# Patient Record
Sex: Male | Born: 1980 | Race: White | Hispanic: No | Smoking: Former smoker
Health system: Southern US, Community
[De-identification: ages and names within clinical notes are randomized; demographics above are authoritative.]

## PROBLEM LIST (undated history)

## (undated) DIAGNOSIS — F419 Anxiety disorder, unspecified: Secondary | ICD-10-CM

## (undated) DIAGNOSIS — M549 Dorsalgia, unspecified: Secondary | ICD-10-CM

## (undated) DIAGNOSIS — Z91018 Allergy to other foods: Secondary | ICD-10-CM

## (undated) DIAGNOSIS — I639 Cerebral infarction, unspecified: Secondary | ICD-10-CM

## (undated) DIAGNOSIS — F32A Depression, unspecified: Secondary | ICD-10-CM

## (undated) DIAGNOSIS — T7840XA Allergy, unspecified, initial encounter: Secondary | ICD-10-CM

## (undated) HISTORY — DX: Cerebral infarction, unspecified: I63.9

## (undated) HISTORY — DX: Allergy, unspecified, initial encounter: T78.40XA

## (undated) HISTORY — PX: ADENOIDECTOMY: SUR15

## (undated) HISTORY — DX: Anxiety disorder, unspecified: F41.9

## (undated) HISTORY — DX: Dorsalgia, unspecified: M54.9

## (undated) HISTORY — PX: TONSILLECTOMY: SUR1361

## (undated) HISTORY — DX: Allergy to other foods: Z91.018

## (undated) HISTORY — DX: Depression, unspecified: F32.A

---

## 2008-05-10 ENCOUNTER — Ambulatory Visit: Payer: Self-pay | Admitting: Internal Medicine

## 2008-09-02 ENCOUNTER — Emergency Department: Payer: Self-pay | Admitting: Emergency Medicine

## 2010-11-07 ENCOUNTER — Ambulatory Visit: Payer: Self-pay | Admitting: Internal Medicine

## 2013-04-17 ENCOUNTER — Encounter (HOSPITAL_COMMUNITY): Payer: Self-pay | Admitting: Emergency Medicine

## 2013-04-17 ENCOUNTER — Emergency Department (HOSPITAL_COMMUNITY): Payer: Managed Care, Other (non HMO)

## 2013-04-17 ENCOUNTER — Emergency Department (HOSPITAL_COMMUNITY)
Admission: EM | Admit: 2013-04-17 | Discharge: 2013-04-17 | Disposition: A | Discharge status: Home / Self Care | Payer: Managed Care, Other (non HMO) | Attending: Emergency Medicine | Admitting: Emergency Medicine

## 2013-04-17 DIAGNOSIS — X500XXA Overexertion from strenuous movement or load, initial encounter: Secondary | ICD-10-CM | POA: Insufficient documentation

## 2013-04-17 DIAGNOSIS — M62838 Other muscle spasm: Secondary | ICD-10-CM | POA: Insufficient documentation

## 2013-04-17 DIAGNOSIS — M549 Dorsalgia, unspecified: Secondary | ICD-10-CM

## 2013-04-17 DIAGNOSIS — F17200 Nicotine dependence, unspecified, uncomplicated: Secondary | ICD-10-CM | POA: Insufficient documentation

## 2013-04-17 DIAGNOSIS — M545 Low back pain, unspecified: Secondary | ICD-10-CM | POA: Insufficient documentation

## 2013-04-17 DIAGNOSIS — Y9389 Activity, other specified: Secondary | ICD-10-CM | POA: Insufficient documentation

## 2013-04-17 DIAGNOSIS — Y92009 Unspecified place in unspecified non-institutional (private) residence as the place of occurrence of the external cause: Secondary | ICD-10-CM | POA: Insufficient documentation

## 2013-04-17 MED ORDER — IBUPROFEN 400 MG PO TABS
800.0000 mg | ORAL_TABLET | Freq: Once | ORAL | Status: AC
  Administered 2013-04-17: 800 mg via ORAL
  Filled 2013-04-17: qty 2

## 2013-04-17 MED ORDER — CYCLOBENZAPRINE HCL 10 MG PO TABS: 10.0000 mg | ORAL_TABLET | Freq: Two times a day (BID) | ORAL | Status: DC | PRN
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
## 2013-04-17 NOTE — ED Notes (Signed)
Pt c/o onset L lower back pain while having sexual intercourse last night. No urinary s/s. States " i think i pulled something."

## 2013-04-17 NOTE — ED Notes (Signed)
Pt ambulates without distress.  

## 2013-04-25 NOTE — ED Provider Notes (Signed)
Medical screening examination/treatment/procedure(s) were performed by non-physician practitioner and as supervising physician I was immediately available for consultation/collaboration.  EKG Interpretation   None         Junius Argyle, MD 04/25/13 [phone]

## 2013-07-08 ENCOUNTER — Emergency Department: Payer: Self-pay | Admitting: Emergency Medicine

## 2013-07-08 LAB — URINALYSIS, COMPLETE
BACTERIA: NONE SEEN
Bilirubin,UR: NEGATIVE
Blood: NEGATIVE
GLUCOSE, UR: NEGATIVE mg/dL (ref 0–75)
KETONE: NEGATIVE
LEUKOCYTE ESTERASE: NEGATIVE
Nitrite: NEGATIVE
Ph: 7 (ref 4.5–8.0)
Protein: NEGATIVE
Specific Gravity: 1.018 (ref 1.003–1.030)
Squamous Epithelial: NONE SEEN
WBC UR: <1 /HPF (ref 0–5)

## 2013-07-08 LAB — LIPASE, BLOOD: Lipase: 109 U/L (ref 73–393)

## 2013-07-08 LAB — COMPREHENSIVE METABOLIC PANEL
ALBUMIN: 4 g/dL (ref 3.4–5.0)
AST: 21 U/L (ref 15–37)
Alkaline Phosphatase: 71 U/L
Anion Gap: 3 — ABNORMAL LOW (ref 7–16)
BUN: 12 mg/dL (ref 7–18)
Bilirubin,Total: 0.6 mg/dL (ref 0.2–1.0)
CALCIUM: 9.1 mg/dL (ref 8.5–10.1)
CO2: 31 mmol/L (ref 21–32)
CREATININE: 1.07 mg/dL (ref 0.60–1.30)
Chloride: 108 mmol/L — ABNORMAL HIGH (ref 98–107)
EGFR (African American): >60
EGFR (Non-African Amer.): >60
GLUCOSE: 106 mg/dL — AB (ref 65–99)
OSMOLALITY: 283 (ref 275–301)
Potassium: 4.3 mmol/L (ref 3.5–5.1)
SGPT (ALT): 64 U/L (ref 12–78)
Sodium: 142 mmol/L (ref 136–145)
TOTAL PROTEIN: 6.6 g/dL (ref 6.4–8.2)

## 2013-07-08 LAB — CBC
HCT: 48.7 % (ref 40.0–52.0)
HGB: 16.9 g/dL (ref 13.0–18.0)
MCH: 30.6 pg (ref 26.0–34.0)
MCHC: 34.8 g/dL (ref 32.0–36.0)
MCV: 88 fL (ref 80–100)
Platelet: 145 10*3/uL — ABNORMAL LOW (ref 150–440)
RBC: 5.54 10*6/uL (ref 4.40–5.90)
RDW: 12.7 % (ref 11.5–14.5)
WBC: 7.2 10*3/uL (ref 3.8–10.6)

## 2015-03-20 IMAGING — CR DG LUMBAR SPINE COMPLETE 4+V
5 series · 5 of 5 positions shown · non-contrast
Comparison: None.

CLINICAL DATA: Persistent back pain

EXAM:
LUMBAR SPINE - COMPLETE 4+ VIEW

[t l-spine a.p.]
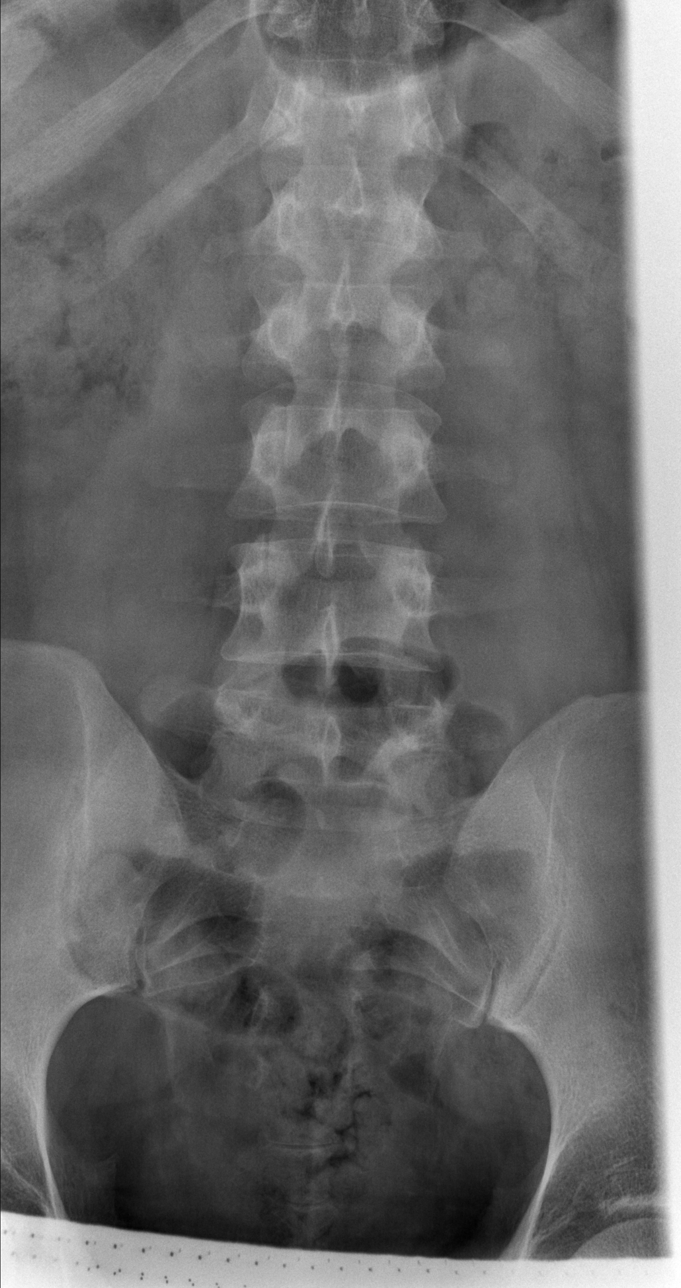

[t l-spine oblique exposure (1 of 2)]
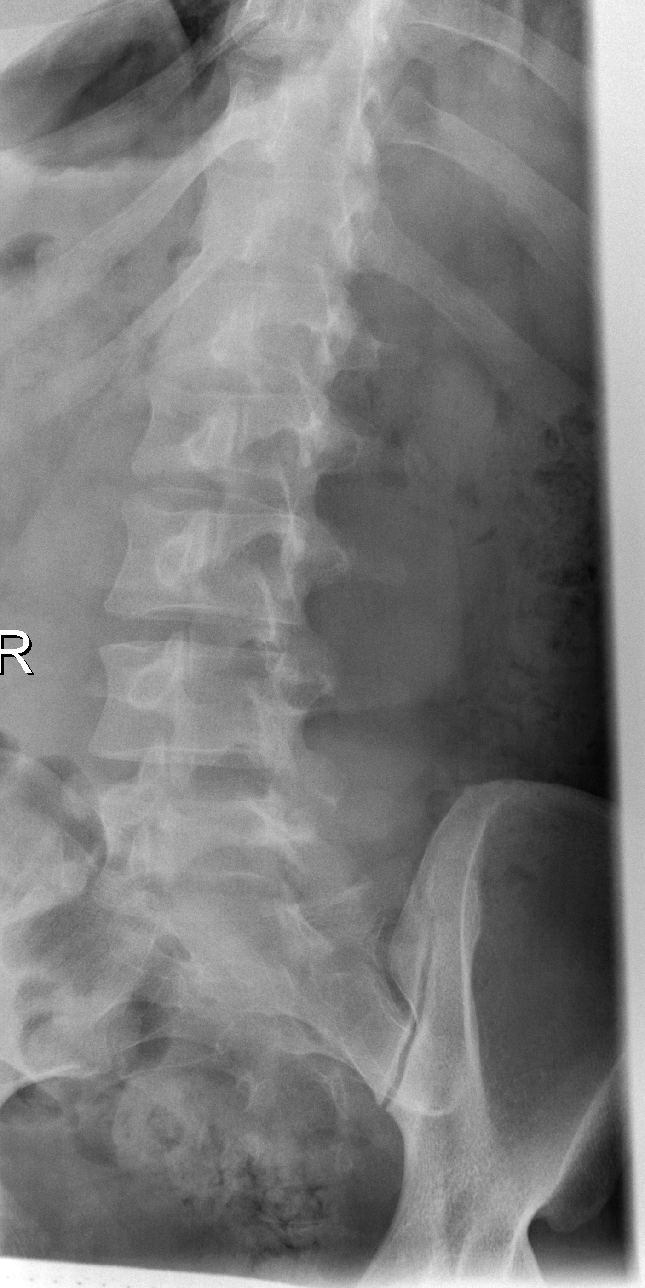

[t l-spine oblique exposure (2 of 2)]
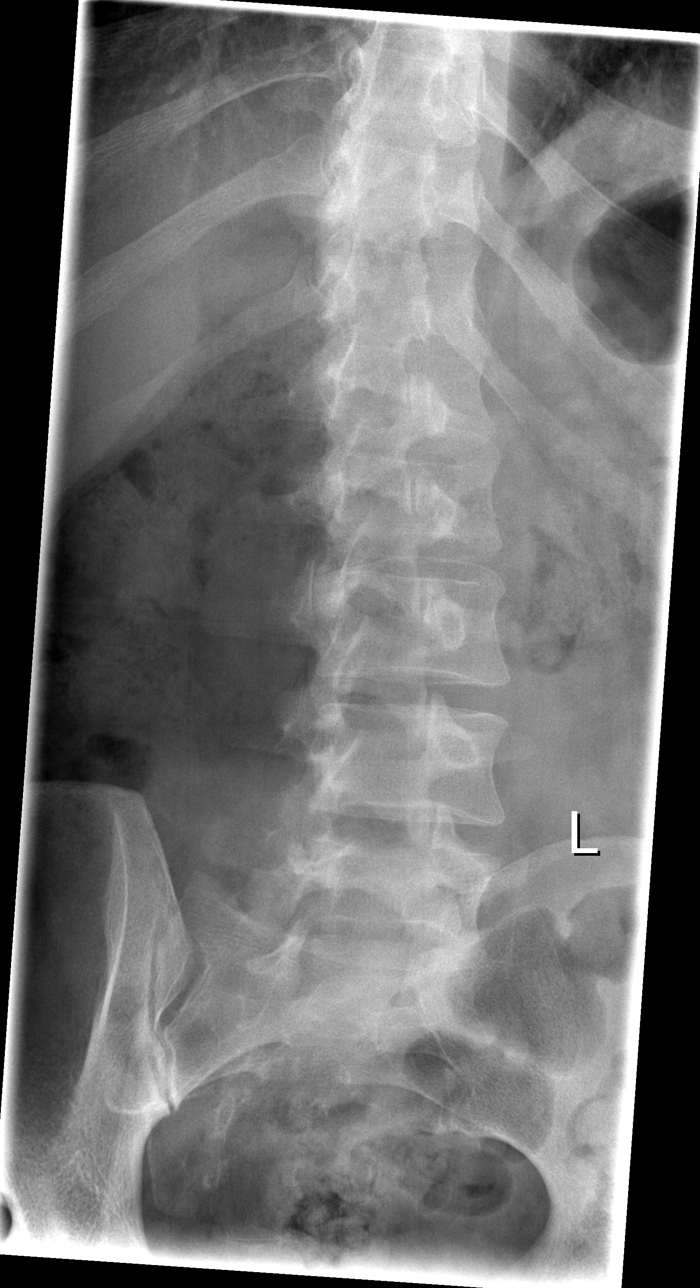

[t l-spine lat]
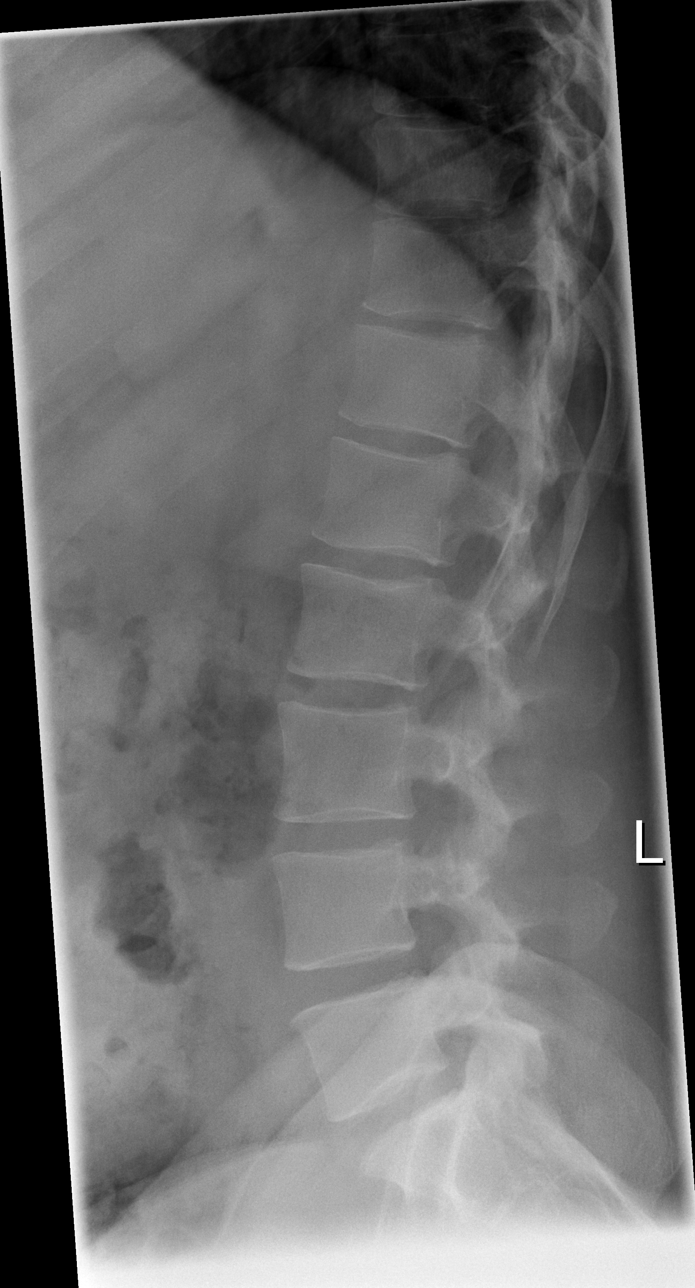

[t l-spine l5-s1 spot]
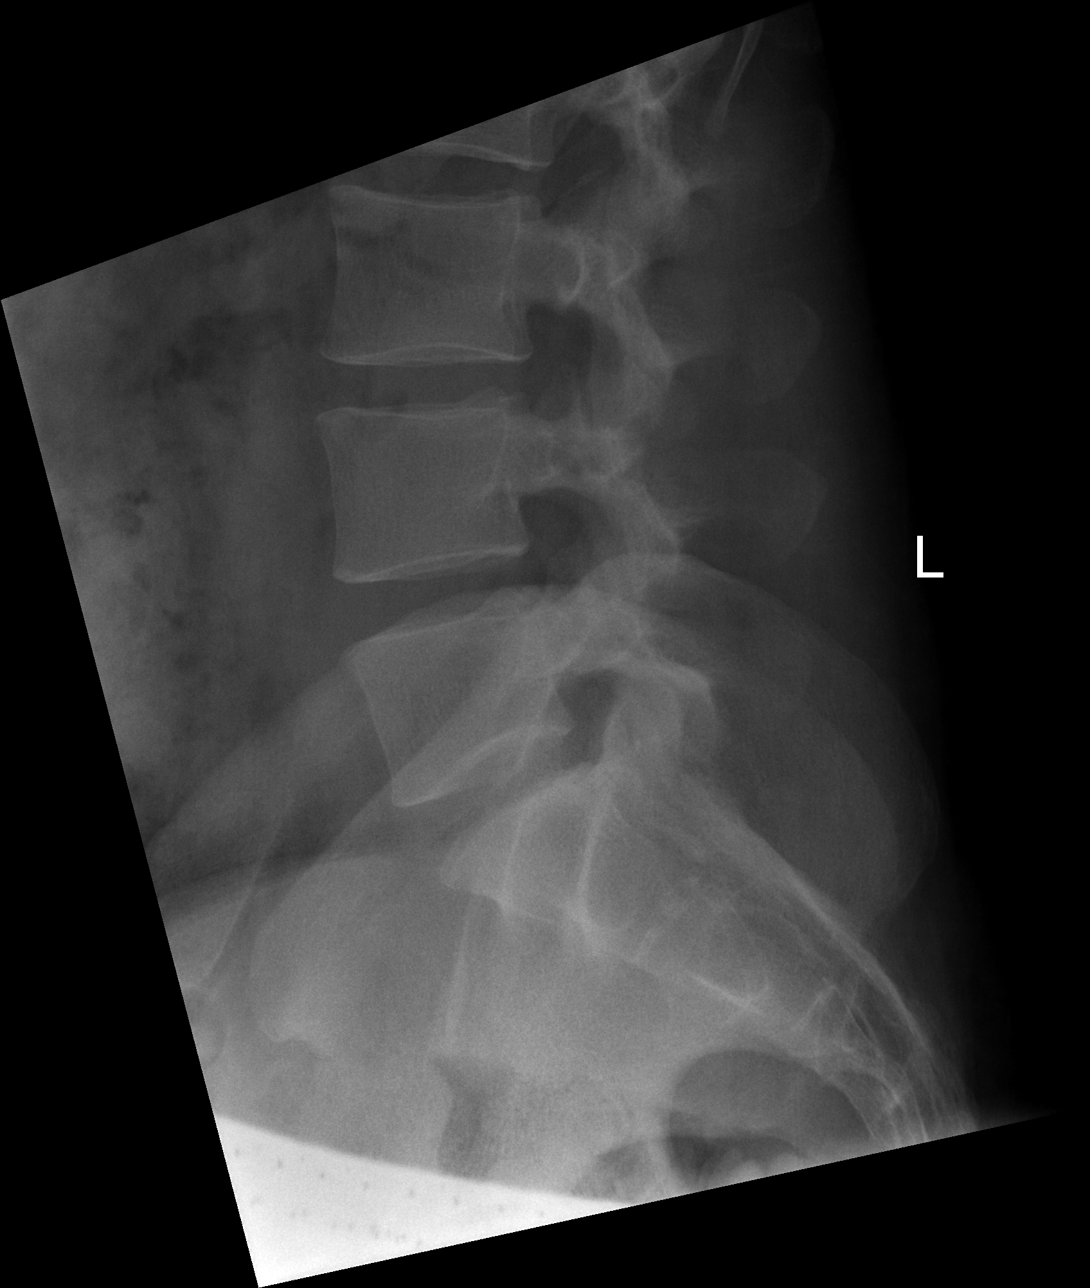

[5 of 5 positions shown; findings below may reference images not displayed]

FINDINGS: There is no evidence of lumbar spine fracture. Alignment is normal.
Intervertebral disc spaces are maintained.
IMPRESSION: No acute osseous finding

## 2018-08-30 DIAGNOSIS — R61 Generalized hyperhidrosis: Secondary | ICD-10-CM | POA: Insufficient documentation

## 2022-03-06 DIAGNOSIS — E669 Obesity, unspecified: Secondary | ICD-10-CM | POA: Insufficient documentation

## 2022-04-08 DIAGNOSIS — R159 Full incontinence of feces: Secondary | ICD-10-CM | POA: Insufficient documentation

## 2022-09-18 ENCOUNTER — Telehealth: Payer: Self-pay | Admitting: General Practice

## 2022-09-18 NOTE — Telephone Encounter (Addendum)
error 

## 2022-10-07 ENCOUNTER — Encounter: Payer: Self-pay | Admitting: Internal Medicine

## 2022-10-07 ENCOUNTER — Ambulatory Visit: Payer: 59 | Admitting: Internal Medicine

## 2022-10-07 VITALS — BP 112/76 | HR 86 | Temp 98.7°F | Ht 71.0 in | Wt 286.0 lb

## 2022-10-07 DIAGNOSIS — F320 Major depressive disorder, single episode, mild: Secondary | ICD-10-CM | POA: Diagnosis not present

## 2022-10-07 DIAGNOSIS — Z7689 Persons encountering health services in other specified circumstances: Secondary | ICD-10-CM

## 2022-10-07 MED ORDER — BUPROPION HCL 100 MG PO TABS: 100.0000 mg | ORAL_TABLET | Freq: Two times a day (BID) | ORAL | 0 refills | Status: DC
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
## 2022-10-07 NOTE — Assessment & Plan Note (Signed)
Start Wellbutrin 100mg  BID  Continue counseling  Recommended continuing regular exercise to help mood

## 2022-10-29 ENCOUNTER — Encounter: Payer: Self-pay | Admitting: Internal Medicine

## 2022-11-09 ENCOUNTER — Other Ambulatory Visit: Payer: Self-pay | Admitting: Internal Medicine

## 2022-11-09 DIAGNOSIS — F320 Major depressive disorder, single episode, mild: Secondary | ICD-10-CM

## 2022-11-10 NOTE — Telephone Encounter (Signed)
Pt called for a refill for his Wellbutrin.

## 2022-11-12 MED ORDER — BUPROPION HCL 100 MG PO TABS: 100.0000 mg | ORAL_TABLET | Freq: Two times a day (BID) | ORAL | 1 refills | Status: DC

## 2022-11-18 ENCOUNTER — Ambulatory Visit: Payer: 59 | Admitting: Internal Medicine

## 2022-11-18 ENCOUNTER — Encounter: Payer: Self-pay | Admitting: Internal Medicine

## 2022-11-18 VITALS — BP 120/82 | HR 85 | Temp 98.2°F | Ht 71.0 in | Wt 285.0 lb

## 2022-11-18 DIAGNOSIS — F320 Major depressive disorder, single episode, mild: Secondary | ICD-10-CM

## 2022-11-18 MED ORDER — BUPROPION HCL ER (XL) 150 MG PO TB24: 150.0000 mg | ORAL_TABLET | Freq: Every day | ORAL | 1 refills | Status: DC

## 2022-11-18 NOTE — Assessment & Plan Note (Signed)
D/C Wellbutrin 100mg  BID d/t insomnia.  Start Wellbutrin XL 150mg  PO daily

## 2022-11-18 NOTE — Progress Notes (Signed)
[facility] [address] [place] [address] Dept: [phone] Dept Fax: [phone]  Acute Care Office Visit  Subjective:   Chris Mcconnell 1980/09/29 11/18/2022  Chief Complaint  Patient presents with   Follow-up    Medications     HPI:    DEPRESSION: Chris Mcconnell presents for the medical management of depression. Reports medication is working very well, except when he takes the evening dose, he has difficulty falling asleep. Reports only getting about 4 hours of sleep.  He states if he forgets to take the evening dose, he has no trouble sleeping.  Current medication regimen: Wellbutrin 100mg  BID  Counseling: yes Well controlled: yes Denies SI/HI.      11/18/2022    8:02 AM 10/07/2022    8:34 AM  PHQ9 SCORE ONLY  PHQ-9 Total Score 0 8        11/18/2022    8:02 AM 10/07/2022    8:34 AM  PHQ9 SCORE ONLY  PHQ-9 Total Score 0 8     The following portions of the patient's history were reviewed and updated as appropriate: past medical history, past surgical history, family history, social history, allergies, medications, and problem list.   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 No pertinent past medical history. Past Surgical History:  Procedure Laterality Date   ADENOIDECTOMY     TONSILLECTOMY     History reviewed. No pertinent family history. Outpatient Medications Prior to Visit  Medication Sig Dispense Refill   buPROPion (WELLBUTRIN) 100 MG tablet Take 1 tablet (100 mg total) by mouth 2 (two) times daily. 180 tablet 1   No facility-administered medications prior to visit.   Allergies  Allergen Reactions   Latex Other (See Comments)    Stomach pain     ROS: A complete ROS was performed with pertinent positives/negatives noted in  the HPI. The remainder of the ROS are negative.    Objective:   Today's Vitals   11/18/22 0802  BP: 120/82  Pulse: 85  Temp: 98.2 F (36.8 C)  TempSrc: Temporal  SpO2: 98%  Weight: 285 lb (129.3 kg)  Height: 5\' 11"  (1.803 m)    GENERAL: Well-appearing, in NAD. Well nourished.  SKIN: Pink, warm and dry.  RESPIRATORY: Respirations even and non-labored. NEUROLOGIC: Steady, even gait.  PSYCH/MENTAL STATUS: Alert, oriented x 3. Cooperative, appropriate mood and affect.    No results found for any visits on 11/18/22.    Assessment & Plan:  Assessment and Plan      1. Depression, major, single episode, mild (HCC) - buPROPion (WELLBUTRIN XL) 150 MG 24 hr tablet; Take 1 tablet (150 mg total) by mouth daily.  Dispense: 90 tablet; Refill: 1 - d/c Wellbutrin 100mg  BID - continue counseling       Meds ordered this encounter  Medications   buPROPion (WELLBUTRIN XL) 150 MG 24 hr tablet    Sig: Take 1 tablet (150 mg total) by mouth daily.    Dispense:  90 tablet    Refill:  1    Order Specific Question:   Supervising Provider    Answer:   Garnette Gunner [[id number]]   Lab Orders  No laboratory test(s) ordered today   No images are attached to the encounter or orders placed in the encounter.  Return in about 4 months (  around 03/21/2023) for Fasting Annual Physical Exam, and as needed for chronic conditions. .   Of note, portions of this note may have been created with voice recognition software Physicist, medical). While this note has been edited for accuracy, occasional wrong-word or 'sound-a-like' substitutions may have occurred due to the inherent limitations of voice recognition software.  Salvatore Decent, FNP

## 2023-03-23 ENCOUNTER — Ambulatory Visit (INDEPENDENT_AMBULATORY_CARE_PROVIDER_SITE_OTHER): Payer: 59 | Admitting: Internal Medicine

## 2023-03-23 ENCOUNTER — Encounter: Payer: Self-pay | Admitting: Internal Medicine

## 2023-03-23 VITALS — BP 124/78 | HR 103 | Temp 98.7°F | Ht 71.0 in | Wt 290.0 lb

## 2023-03-23 DIAGNOSIS — Z6841 Body Mass Index (BMI) 40.0 and over, adult: Secondary | ICD-10-CM

## 2023-03-23 DIAGNOSIS — Z0000 Encounter for general adult medical examination without abnormal findings: Secondary | ICD-10-CM | POA: Diagnosis not present

## 2023-03-23 DIAGNOSIS — E66813 Obesity, class 3: Secondary | ICD-10-CM | POA: Insufficient documentation

## 2023-03-23 LAB — COMPREHENSIVE METABOLIC PANEL
ALT: 36 U/L (ref 0–53)
AST: 19 U/L (ref 0–37)
Albumin: 4.4 g/dL (ref 3.5–5.2)
Alkaline Phosphatase: 73 U/L (ref 39–117)
BUN: 15 mg/dL (ref 6–23)
CO2: 24 meq/L (ref 19–32)
Calcium: 9 mg/dL (ref 8.4–10.5)
Chloride: 107 meq/L (ref 96–112)
Creatinine, Ser: 1.19 mg/dL (ref 0.40–1.50)
GFR: 75.61 mL/min (ref >60.00)
Glucose, Bld: 93 mg/dL (ref 70–99)
Potassium: 4.2 meq/L (ref 3.5–5.1)
Sodium: 142 meq/L (ref 135–145)
Total Bilirubin: 0.8 mg/dL (ref 0.2–1.2)
Total Protein: 6.1 g/dL (ref 6.0–8.3)

## 2023-03-23 LAB — CBC WITH DIFFERENTIAL/PLATELET
Basophils Absolute: 0.1 10*3/uL (ref 0.0–0.1)
Basophils Relative: 0.8 % (ref 0.0–3.0)
Eosinophils Absolute: 0.2 10*3/uL (ref 0.0–0.7)
Eosinophils Relative: 2.9 % (ref 0.0–5.0)
HCT: 51.8 % (ref 39.0–52.0)
Hemoglobin: 17.7 g/dL — ABNORMAL HIGH (ref 13.0–17.0)
Lymphocytes Relative: 15.2 % (ref 12.0–46.0)
Lymphs Abs: 1.1 10*3/uL (ref 0.7–4.0)
MCHC: 34.2 g/dL (ref 30.0–36.0)
MCV: 86.3 fL (ref 78.0–100.0)
Monocytes Absolute: 0.5 10*3/uL (ref 0.1–1.0)
Monocytes Relative: 7.5 % (ref 3.0–12.0)
Neutro Abs: 5.2 10*3/uL (ref 1.4–7.7)
Neutrophils Relative %: 73.6 % (ref 43.0–77.0)
Platelets: 163 10*3/uL (ref 150.0–400.0)
RBC: 6 Mil/uL — ABNORMAL HIGH (ref 4.22–5.81)
RDW: 13.1 % (ref 11.5–15.5)
WBC: 7 10*3/uL (ref 4.0–10.5)

## 2023-03-23 LAB — LIPID PANEL
Cholesterol: 185 mg/dL (ref 0–200)
HDL: 34.8 mg/dL — ABNORMAL LOW (ref >39.00)
LDL Cholesterol: 120 mg/dL — ABNORMAL HIGH (ref 0–99)
NonHDL: 149.87
Total CHOL/HDL Ratio: 5
Triglycerides: 151 mg/dL — ABNORMAL HIGH (ref 0.0–149.0)
VLDL: 30.2 mg/dL (ref 0.0–40.0)

## 2023-03-23 LAB — TSH: TSH: 1.2 u[IU]/mL (ref 0.35–5.50)

## 2023-03-23 NOTE — Progress Notes (Signed)
Subjective:   Chris Mcconnell 1980-07-08 03/23/2023  CC: Chief Complaint  Patient presents with   Annual Exam    HPI: Chris Mcconnell is a 42 y.o. male who presents for a routine health maintenance exam.  Labs collected at time of visit.    WEIGHT MANAGEMENT: [name] presents to discuss weight management. He would like to lose weight. He states he has noticed with the weight gain he gets easily tired with simple tasks. Would like for this to change.  Adhering to healthy diet: no  Regular exercise: very little, some walking  Wt Readings from Last 3 Encounters:  03/23/23 290 lb (131.5 kg)  11/18/22 285 lb (129.3 kg)  10/07/22 286 lb (129.7 kg)    HEALTH SCREENINGS: - PSA (50+): Not applicable  No results found for: "PSA1", "PSA"   - Colonoscopy (45+): Not applicable  - AAA Screening: Not applicable  Men age 43-75 who have ever smoked - Lung Cancer screening with low-dose CT: Not applicable Adults age 48-80 who are current cigarette smokers or quit within the last 15 years. Must have 20 pack year history.   Depression and Anxiety Screen done today and results listed below:     03/23/2023    8:55 AM 11/18/2022    8:02 AM 10/07/2022    8:34 AM  Depression screen PHQ 2/9  Decreased Interest 2 0 1  Down, Depressed, Hopeless 1 0 2  PHQ - 2 Score 3 0 3  Altered sleeping 1  1  Tired, decreased energy 1  1  Change in appetite 2  0  Feeling bad or failure about yourself  1  1  Trouble concentrating 0  1  Moving slowly or fidgety/restless 0  0  Suicidal thoughts 0  1  PHQ-9 Score 8  8  Difficult doing work/chores Somewhat difficult  Somewhat difficult      03/23/2023    8:56 AM 10/07/2022    8:34 AM  GAD 7 : Generalized Anxiety Score  Nervous, Anxious, on Edge 1 1  Control/stop worrying 1 1  Worry too much - different things 1 1  Trouble relaxing 1 1  Restless 0 0  Easily annoyed or irritable 1 0  Afraid - awful might happen 1 1  Total GAD 7 Score 6 5   Anxiety Difficulty Somewhat difficult Somewhat difficult    IMMUNIZATIONS:  - Tdap: Tetanus vaccination status reviewed: last tetanus booster within 10 years. - Influenza: Refused - Pneumovax: Not applicable - Prevnar: Not applicable - Zostavax vaccine (50+): Not applicable   Past medical history, surgical history, medications, allergies, family history and social history reviewed with patient today and changes made to appropriate areas of the chart.   History reviewed. No pertinent past medical history.  Past Surgical History:  Procedure Laterality Date   ADENOIDECTOMY     TONSILLECTOMY      Current Outpatient Medications on File Prior to Visit  Medication Sig   buPROPion (WELLBUTRIN XL) 150 MG 24 hr tablet Take 1 tablet (150 mg total) by mouth daily.   No current facility-administered medications on file prior to visit.    Allergies  Allergen Reactions   Latex Other (See Comments)    Stomach pain     Social History   Socioeconomic History   Marital status: Single    Spouse name: Not on file   Number of children: Not on file   Years of education: Not on file   Highest education level: Associate degree: occupational, technical,  or vocational program  Occupational History   Not on file  Tobacco Use   Smoking status: Some Days    Types: Cigars   Smokeless tobacco: Not on file  Substance and Sexual Activity   Alcohol use: Not Currently   Drug use: No   Sexual activity: Not Currently  Other Topics Concern   Not on file  Social History Narrative   Not on file   Social Determinants of Health   Financial Resource Strain: Low Risk  (11/17/2022)   Overall Financial Resource Strain (CARDIA)    Difficulty of Paying Living Expenses: Not hard at all  Food Insecurity: No Food Insecurity (11/17/2022)   Hunger Vital Sign    Worried About Running Out of Food in the Last Year: Never true    Ran Out of Food in the Last Year: Never true  Transportation Needs: No  Transportation Needs (11/17/2022)   PRAPARE - Administrator, Civil Service (Medical): No    Lack of Transportation (Non-Medical): No  Physical Activity: Unknown (11/17/2022)   Exercise Vital Sign    Days of Exercise per Week: 0 days    Minutes of Exercise per Session: Not on file  Stress: Stress Concern Present (11/17/2022)   [employer] of Occupational Health - Occupational Stress Questionnaire    Feeling of Stress : Rather much  Social Connections: Moderately Integrated (11/17/2022)   Social Connection and Isolation Panel [NHANES]    Frequency of Communication with Friends and Family: Three times a week    Frequency of Social Gatherings with Friends and Family: Never    Attends Religious Services: More than 4 times per year    Active Member of [employer] or Organizations: Yes    Attends Engineer, structural: More than 4 times per year    Marital Status: Divorced  Catering manager Violence: Not on file   Social History   Tobacco Use  Smoking Status Some Days   Types: Cigars  Smokeless Tobacco Not on file   Social History   Substance and Sexual Activity  Alcohol Use Not Currently     History reviewed. No pertinent family history.   ROS: Denies fever, fatigue, hearing or vision changes, cardiac or respiratory complaints. Denies neurological deficits, musculoskeletal complaints, gastrointestinal or genitourinary complaints, mental health complaints, and skin changes.   Objective:   Today's Vitals   03/23/23 0851  BP: 124/78  Pulse: (!) 103  Temp: 98.7 F (37.1 C)  TempSrc: Temporal  SpO2: 98%  Weight: 290 lb (131.5 kg)  Height: 5\' 11"  (1.803 m)    GENERAL APPEARANCE: Well-appearing, in NAD. Well nourished.  SKIN: Pink, warm and dry. Turgor normal. No rash, lesion, ulceration, or ecchymoses.  Skin tag to R. Buttock. Hair evenly distributed.  HEENT: HEAD: Normocephalic.  EYES: PERRLA. EOMI. Lids intact w/o defect. Sclera white, Conjunctiva pink  w/o exudate.  EARS: External ear w/o redness, swelling, masses or lesions. EAC clear. TM's intact, translucent w/o bulging, appropriate landmarks visualized. Appropriate acuity to conversational tones.  NOSE: Septum midline w/o deformity. Nares patent, mucosa pink and non-inflamed w/o drainage. No sinus tenderness.  THROAT: Uvula midline. Oropharynx clear. Tonsils absent. Oral mucosa pink and moist.  NECK: Supple, Trachea midline. Full ROM w/o pain or tenderness. No lymphadenopathy. Thyroid non-tender w/o enlargement or palpable masses.  RESPIRATORY: Chest wall symmetrical w/o masses. Respirations even and non-labored. Breath sounds clear to auscultation bilaterally. No wheezes, rales, rhonchi, or crackles. CARDIAC: S1, S2 present, regular rate and rhythm. No gallops,  murmurs, rubs, or clicks.  Capillary refill <2 seconds. Peripheral pulses 2+ bilaterally. GI: Abdomen soft w/o distention. Normoactive bowel sounds. No palpable masses or tenderness. No guarding or rebound tenderness. Liver and spleen w/o tenderness or enlargement. No CVA tenderness.  GU:  deferred exam. MSK: Muscle tone and strength appropriate for age, w/o atrophy or abnormal movement. EXTREMITIES: Active ROM intact, w/o tenderness, crepitus, or contracture. No obvious joint deformities or effusions. No clubbing, edema, or cyanosis.  NEUROLOGIC: CN's II-XII intact. Motor strength symmetrical with no obvious weakness. No sensory deficits. Steady, even gait.  PSYCH/MENTAL STATUS: Alert, oriented x 3. Cooperative, appropriate mood and affect.   No results found for this or any previous visit.  Assessment & Plan:  1. Encounter for general adult medical examination without abnormal findings - CBC with Differential/Platelet - Comprehensive metabolic panel - TSH - Lipid panel  2. Class 3 severe obesity due to excess calories without serious comorbidity with body mass index (BMI) of 40.0 to 44.9 in adult (HCC) - Amb Ref to Medical  Weight Management - discussed healthy eating and begin regular exercise routine    Orders Placed This Encounter  Procedures   CBC with Differential/Platelet   Comprehensive metabolic panel   TSH   Lipid panel   Amb Ref to Medical Weight Management    Referral Priority:   Routine    Referral Type:   Consultation    Number of Visits Requested:   1    PATIENT COUNSELING: - Encouraged to adjust caloric intake to maintain or achieve ideal body weight, to reduce intake of dietary saturated fat and total fat, to limit sodium intake by avoiding high sodium foods and not adding table salt, and to maintain adequate dietary potassium and calcium preferably from fresh fruits, vegetables, and low-fat dairy products.   - Advised to avoid cigarette smoking. - Discussed with the patient that most people either abstain from alcohol or drink within safe limits (<=14/week and <=4 drinks/occasion for males, <=7/weeks and <= 3 drinks/occasion for females) and that the risk for alcohol disorders and other health effects rises proportionally with the number of drinks per week and how often a drinker exceeds daily limits. - Discussed cessation/primary prevention of drug use and availability of treatment for abuse.   - Stressed the importance of regular exercise - Injury prevention: Discussed safety belts, safety helmets, smoke detector, smoking near bedding or upholstery.  - Sexuality: Discussed sexually transmitted diseases, partner selection, use of condoms, avoidance of unintended pregnancy  and contraceptive alternatives.   NEXT PREVENTATIVE PHYSICAL DUE IN 1 YEAR.  Return in about 6 months (around 09/20/2023) for Anxiety/Depression.  Salvatore Decent, FNP

## 2023-04-27 DIAGNOSIS — Z0289 Encounter for other administrative examinations: Secondary | ICD-10-CM

## 2023-04-28 ENCOUNTER — Encounter: Payer: Self-pay | Admitting: Family Medicine

## 2023-04-28 ENCOUNTER — Ambulatory Visit: Payer: 59 | Admitting: Family Medicine

## 2023-04-28 VITALS — BP 133/82 | HR 94 | Temp 98.0°F | Ht 71.0 in | Wt 287.0 lb

## 2023-04-28 DIAGNOSIS — Z6841 Body Mass Index (BMI) 40.0 and over, adult: Secondary | ICD-10-CM | POA: Diagnosis not present

## 2023-04-28 DIAGNOSIS — E66813 Obesity, class 3: Secondary | ICD-10-CM

## 2023-04-28 DIAGNOSIS — F320 Major depressive disorder, single episode, mild: Secondary | ICD-10-CM

## 2023-04-28 NOTE — Progress Notes (Signed)
Office: [phone]  /  Fax: [phone]   Initial Visit  Chris Mcconnell was seen in clinic today to evaluate for obesity. He is interested in losing weight to improve overall health and reduce the risk of weight related complications. He presents today to review program treatment options, initial physical assessment, and evaluation.     He was referred by: PCP  When asked what else they would like to accomplish? He states:   Weight history:  he had a more active job at [employer] and was working out and moved to a sedentary job 12 years ago.  He has gained > 80 lb since then.  He is now feeling poorly at this weight.    When asked how has your weight affected you? He states: Contributed to orthopedic problems or mobility issues, Having fatigue, and Having poor endurance  Some associated conditions: Other: migraines and low back pain  Contributing factors: Consumption of processed foods, Reduced physical activity, and Eating patterns  Weight promoting medications identified: None  Current nutrition plan: None Latex allergy   Current level of physical activity: NEAT  Current or previous pharmacotherapy: Buproprion  Response to medication: Ineffective so it was discontinued   Past medical history includes:  History reviewed. No pertinent past medical history.   Objective:   BP 133/82   Pulse 94   Temp 98 F (36.7 C)   Ht 5\' 11"  (1.803 m)   Wt 287 lb (130.2 kg)   SpO2 98%   BMI 40.03 kg/m  He was weighed on the bioimpedance scale: Body mass index is 40.03 kg/m.  Peak Weight:290 , Body Fat%:37.2, Visceral Fat Rating:21, Weight trend over the last 12 months: Increasing  General:  Alert, oriented and cooperative. Patient is in no acute distress.  Respiratory: Normal respiratory effort, no problems with respiration noted   Gait: able to ambulate independently  Mental Status: Normal mood and affect. Normal behavior. Normal judgment and thought content.   DIAGNOSTIC DATA  REVIEWED:  BMET    Component Value Date/Time   NA 142 03/23/2023 0924   NA 142 07/08/2013 1203   K 4.2 03/23/2023 0924   K 4.3 07/08/2013 1203   CL 107 03/23/2023 0924   CL 108 (H) 07/08/2013 1203   CO2 24 03/23/2023 0924   CO2 31 07/08/2013 1203   GLUCOSE 93 03/23/2023 0924   GLUCOSE 106 (H) 07/08/2013 1203   BUN 15 03/23/2023 0924   BUN 12 07/08/2013 1203   CREATININE 1.19 03/23/2023 0924   CREATININE 1.07 07/08/2013 1203   CALCIUM 9.0 03/23/2023 0924   CALCIUM 9.1 07/08/2013 1203   GFRNONAA >60 07/08/2013 1203   GFRAA >60 07/08/2013 1203   No results found for: "HGBA1C" No results found for: "INSULIN" CBC    Component Value Date/Time   WBC 7.0 03/23/2023 0924   RBC 6.00 (H) 03/23/2023 0924   HGB 17.7 (H) 03/23/2023 0924   HGB 16.9 07/08/2013 1203   HCT 51.8 03/23/2023 0924   HCT 48.7 07/08/2013 1203   PLT 163.0 03/23/2023 0924   PLT 145 (L) 07/08/2013 1203   MCV 86.3 03/23/2023 0924   MCV 88 07/08/2013 1203   MCH 30.6 07/08/2013 1203   MCHC 34.2 03/23/2023 0924   RDW 13.1 03/23/2023 0924   RDW 12.7 07/08/2013 1203   Iron/TIBC/Ferritin/ %Sat No results found for: "IRON", "TIBC", "FERRITIN", "IRONPCTSAT" Lipid Panel     Component Value Date/Time   CHOL 185 03/23/2023 0924   TRIG 151.0 (H) 03/23/2023 6578  HDL 34.80 (L) 03/23/2023 0924   CHOLHDL 5 03/23/2023 0924   VLDL 30.2 03/23/2023 0924   LDLCALC 120 (H) 03/23/2023 0924   Hepatic Function Panel     Component Value Date/Time   PROT 6.1 03/23/2023 0924   PROT 6.6 07/08/2013 1203   ALBUMIN 4.4 03/23/2023 0924   ALBUMIN 4.0 07/08/2013 1203   AST 19 03/23/2023 0924   AST 21 07/08/2013 1203   ALT 36 03/23/2023 0924   ALT 64 07/08/2013 1203   ALKPHOS 73 03/23/2023 0924   ALKPHOS 71 07/08/2013 1203   BILITOT 0.8 03/23/2023 0924   BILITOT 0.6 07/08/2013 1203      Component Value Date/Time   TSH 1.20 03/23/2023 0924     Assessment and Plan:   Depression, major, single episode, mild  (HCC)  Class 3 severe obesity due to excess calories with body mass index (BMI) of 40.0 to 44.9 in adult, unspecified whether serious comorbidity present (HCC)        Obesity Treatment / Action Plan:  Patient will work on garnering support from family and friends to begin weight loss journey. Will work on eliminating or reducing the presence of highly palatable, calorie dense foods in the home. Will complete provided nutritional and psychosocial assessment questionnaire before the next appointment. Will be scheduled for indirect calorimetry to determine resting energy expenditure in a fasting state.  This will allow [place] to create a reduced calorie, high-protein meal plan to promote loss of fat mass while preserving muscle mass. Will think about ideas on how to incorporate physical activity into their daily routine. Counseled on the health benefits of losing 5%-15% of total body weight. Was counseled on nutritional approaches to weight loss and benefits of reducing processed foods and consuming plant-based foods and high quality protein as part of nutritional weight management. Was counseled on pharmacotherapy and role as an adjunct in weight management.   Obesity Education Performed Today:  He was weighed on the bioimpedance scale and results were discussed and documented in the synopsis.  We discussed obesity as a disease and the importance of a more detailed evaluation of all the factors contributing to the disease.  We discussed the importance of long term lifestyle changes which include nutrition, exercise and behavioral modifications as well as the importance of customizing this to his specific health and social needs.  We discussed the benefits of reaching a healthier weight to alleviate the symptoms of existing conditions and reduce the risks of the biomechanical, metabolic and psychological effects of obesity.  Chris Mcconnell appears to be in the action stage of change and states  they are ready to start intensive lifestyle modifications and behavioral modifications.  20 minutes was spent today on this visit including the above counseling, pre-visit chart review, and post-visit documentation.  Reviewed by clinician on day of visit: allergies, medications, problem list, medical history, surgical history, family history, social history, and previous encounter notes pertinent to obesity diagnosis.    Seymour Bars, D.O. DABFM, DABOM [facility] [phone] [place], [place] [address] [phone]

## 2023-05-21 ENCOUNTER — Ambulatory Visit (INDEPENDENT_AMBULATORY_CARE_PROVIDER_SITE_OTHER): Payer: 59 | Admitting: Family Medicine

## 2023-05-21 ENCOUNTER — Encounter: Payer: Self-pay | Admitting: Family Medicine

## 2023-05-21 VITALS — BP 137/84 | HR 87 | Temp 98.1°F | Ht 71.0 in | Wt 287.0 lb

## 2023-05-21 DIAGNOSIS — G43709 Chronic migraine without aura, not intractable, without status migrainosus: Secondary | ICD-10-CM | POA: Diagnosis not present

## 2023-05-21 DIAGNOSIS — G8929 Other chronic pain: Secondary | ICD-10-CM | POA: Diagnosis not present

## 2023-05-21 DIAGNOSIS — F320 Major depressive disorder, single episode, mild: Secondary | ICD-10-CM | POA: Diagnosis not present

## 2023-05-21 DIAGNOSIS — Z6841 Body Mass Index (BMI) 40.0 and over, adult: Secondary | ICD-10-CM

## 2023-05-21 DIAGNOSIS — M545 Low back pain, unspecified: Secondary | ICD-10-CM

## 2023-05-21 DIAGNOSIS — R5383 Other fatigue: Secondary | ICD-10-CM

## 2023-05-21 DIAGNOSIS — D582 Other hemoglobinopathies: Secondary | ICD-10-CM | POA: Diagnosis not present

## 2023-05-21 DIAGNOSIS — E662 Morbid (severe) obesity with alveolar hypoventilation: Secondary | ICD-10-CM

## 2023-05-21 DIAGNOSIS — R0602 Shortness of breath: Secondary | ICD-10-CM

## 2023-05-21 DIAGNOSIS — G4730 Sleep apnea, unspecified: Secondary | ICD-10-CM

## 2023-05-21 DIAGNOSIS — E66813 Obesity, class 3: Secondary | ICD-10-CM

## 2023-05-21 NOTE — Progress Notes (Signed)
1  At a Glance:  Vitals Temp: 98.1 F (36.7 C) BP: 137/84 Pulse Rate: 87 SpO2: 97 %   Anthropometric Measurements Height: 5\' 11"  (1.803 m) Weight: 287 lb (130.2 kg) BMI (Calculated): 40.05 Starting Weight: 287lb   Body Composition  Body Fat %: 38 % Fat Mass (lbs): 109 lbs Muscle Mass (lbs): 169.2 lbs Total Body Water (lbs): 123.6 lbs Visceral Fat Rating : 21   Other Clinical Data Fasting: Yes Labs: Yes Today's Visit #: 1 Starting Date: 05/21/23    EKG: Normal sinus rhythm, rate 87.  Indirect Calorimeter completed today shows a VO2 of 438 and a REE of 3024.  His calculated basal metabolic rate is 5284 thus his basal metabolic rate is better than expected.  Chief Complaint:  Obesity   Subjective:  Chris Mcconnell (MR# [id number]) is a 43 y.o. male who presents for evaluation and treatment of obesity and related comorbidities.   Chris Mcconnell is currently in the action stage of change and ready to dedicate time achieving and maintaining a healthier weight. Gent is interested in becoming our patient and working on intensive lifestyle modifications including (but not limited to) diet and exercise for weight loss.  Chris Mcconnell has been struggling with his weight. He has been unsuccessful in either losing weight, maintaining weight loss, or reaching his healthy weight goal.  The has gained weight since working a sedentary job.  He has a hx of depression and chronic daily headache.    Chris Mcconnell's habits were reviewed today and are as follows: his desired weight loss is 60 lbs. he skips meals frequently, usually breakfast and eats out for lunch.  he is frequently drinking liquids with calories, but quit soda and sweet tea more recently.  He likes to have juice with his 49 yo son.  he frequently makes poor food choices, he has problems with excessive hunger, and he frequently eats larger portions than normal.  He eats out 10+ meals/ week and overeats at mealtime.  Denies over snacking.   Other  Fatigue Ayrton admits to daytime somnolence and admits to waking up still tired. Patient has a history of symptoms of morning fatigue. Chris Mcconnell generally gets 6 or 7 hours of sleep per night, and states that he has generally restful sleep. Snoring is present. Apneic episodes are not present. Epworth Sleepiness Score is 5.  He has chronic daily headache, starting in the mornings.  Shortness of Breath Chris Mcconnell notes increasing shortness of breath with exercising and seems to be worsening over time with weight gain. He notes getting out of breath sooner with activity than he used to. This has gotten worse recently. Chris Mcconnell denies shortness of breath at rest or orthopnea.   Depression Screen Jahlil's Food and Mood (modified PHQ-9) score was 6.     03/23/2023    8:55 AM  Depression screen PHQ 2/9  Decreased Interest 2  Down, Depressed, Hopeless 1  PHQ - 2 Score 3  Altered sleeping 1  Tired, decreased energy 1  Change in appetite 2  Feeling bad or failure about yourself  1  Trouble concentrating 0  Moving slowly or fidgety/restless 0  Suicidal thoughts 0  PHQ-9 Score 8  Difficult doing work/chores Somewhat difficult     Assessment and Plan:   Other Fatigue Chris Mcconnell does feel that his weight is causing his energy to be lower than it should be. Fatigue may be related to obesity, depression or many other causes. Labs will be ordered, and in the meanwhile, Chris Mcconnell will focus on  self care including making healthy food choices, increasing physical activity and focusing on stress reduction.  Shortness of Breath Other does feel that he gets out of breath more easily that he used to when he exercises. Chris Mcconnell's shortness of breath appears to be obesity related and exercise induced. He has agreed to work on weight loss and gradually increase exercise to treat his exercise induced shortness of breath. Will continue to monitor closely.  Chris Mcconnell had a positive depression screening. Depression is commonly associated  with obesity and often results in emotional eating behaviors. We will monitor this closely and work on CBT to help improve the non-hunger eating patterns. Referral to Psychology may be required if no improvement is seen as he continues in our clinic.    Problem List Items Addressed This Visit     Depression, major, single episode, mild (HCC)   He reports a stable mood on Wellbutrin XL 150 mg once daily. He reports lacking motivation to get up on the weekends He has a stable job with good support  Continue current medications.  Consider adding CBT with Dr. Dewaine Conger      Chronic midline low back pain   Worsening.  Patient complains of worsening low back pain since adding weight over the past several years.  This has limited his ability to do vigorous exercise.  He denies intake of over-the-counter pain medication.  Will look for improvements in back pain with weight reduction. Adding in gentle stretching or physical therapy may be beneficial      Chronic migraine without aura without status migrainosus, not intractable   He reports a long hx of chronic daily headache, not on any preventive agents He is waking with a headache and reports heavy snoring but has never had a sleep study He denies taking rescue medication on a regular basis He has recently cut out soda and sweet tea and has increased his water intake  Will order a sleep study Begin working on proper nutrition, hydration with water and improving sleep hygiene Consider use of topiramate for both migraine prevention and weight reduction      Sleep-disordered breathing   Patient does appear to be high risk for obstructive sleep apnea given his Epworth score and complaints of morning headache with morning fatigue.  He has never had a polysomnogram done.  With his BMI of 40, he appears to be higher risk.  Referral to Dr. Jerre Simon made      Relevant Orders   Ambulatory referral to Sleep Studies   Elevated hemoglobin (HCC)    Patient has had several elevated hemoglobins.  He quit smoking cigars 1 year ago.  He denies a previous history of hemochromatosis.  He reports being well-hydrated today.  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)           Cyprian is currently in the action stage of change and his goal is to continue with  weight loss efforts. I recommend Demitrious begin the structured treatment plan as follows:  He has agreed to Category 4 Plan  Exercise goals: All adults should avoid inactivity. Some activity is better than none, and adults who participate in any amount of physical activity, gain some health benefits. Start with light walking  Behavioral modification strategies:increasing lean protein intake, decreasing simple carbohydrates, increasing vegetables, increase H2O intake, decrease liquid calories, increase high fiber foods, decreasing eating out, no skipping meals, meal planning and cooking strategies, keeping healthy foods in the home, better snacking choices, and planning for success  He was informed of the importance of frequent follow-up visits to maximize his success with intensive lifestyle modifications for his multiple health conditions. He was informed we would discuss his lab results at his next visit unless there is a critical issue that needs to be addressed sooner. Kenneth agreed to keep his next visit at the agreed upon time to discuss these results.  Objective:  General: Cooperative, alert, well developed, in no acute distress. HEENT: Conjunctivae and lids  unremarkable. Cardiovascular: Regular rhythm.  Lungs: Normal work of breathing. Neurologic: No focal deficits.   Lab Results  Component Value Date   CREATININE 1.19 03/23/2023   BUN 15 03/23/2023   NA 142 03/23/2023   K 4.2 03/23/2023   CL 107 03/23/2023   CO2 24 03/23/2023   Lab Results  Component Value Date   ALT 36 03/23/2023   AST 19 03/23/2023   ALKPHOS 73 03/23/2023   BILITOT 0.8 03/23/2023   No results found for: "HGBA1C" No results found for: "INSULIN" Lab Results  Component Value Date   TSH 1.20 03/23/2023   Lab Results  Component Value Date   CHOL 185 03/23/2023   HDL 34.80 (L) 03/23/2023   LDLCALC 120 (H) 03/23/2023   TRIG 151.0 (H) 03/23/2023   CHOLHDL 5 03/23/2023   Lab Results  Component Value Date   WBC 7.0 03/23/2023   HGB 17.7 (H) 03/23/2023   HCT 51.8 03/23/2023   MCV 86.3 03/23/2023   PLT 163.0 03/23/2023   No results found for: "IRON", "TIBC", "FERRITIN"  Attestation Statements:  Reviewed by clinician on day of visit: allergies, medications, problem list, medical history, surgical history, family history, social history, and previous encounter notes.  Time spent on visit including pre-visit chart review and post-visit charting and care was 47 minutes  Glennis Brink, DO

## 2023-05-21 NOTE — Assessment & Plan Note (Signed)
He reports a stable mood on Wellbutrin XL 150 mg once daily. He reports lacking motivation to get up on the weekends He has a stable job with good support  Continue current medications.  Consider adding CBT with Dr. Dewaine Conger

## 2023-05-21 NOTE — Assessment & Plan Note (Signed)
Worsening.  Patient complains of worsening low back pain since adding weight over the past several years.  This has limited his ability to do vigorous exercise.  He denies intake of over-the-counter pain medication.  Will look for improvements in back pain with weight reduction. Adding in gentle stretching or physical therapy may be beneficial

## 2023-05-21 NOTE — Assessment & Plan Note (Signed)
Patient does appear to be high risk for obstructive sleep apnea given his Epworth score and complaints of morning headache with morning fatigue.  He has never had a polysomnogram done.  With his BMI of 40, he appears to be higher risk.  Referral to Dr. Jerre Simon made

## 2023-05-21 NOTE — Assessment & Plan Note (Signed)
Patient has had several elevated hemoglobins.  He quit smoking cigars 1 year ago.  He denies a previous history of hemochromatosis.  He reports being well-hydrated today.  Will update his CBC today

## 2023-05-21 NOTE — Assessment & Plan Note (Signed)
He reports a long hx of chronic daily headache, not on any preventive agents He is waking with a headache and reports heavy snoring but has never had a sleep study He denies taking rescue medication on a regular basis He has recently cut out soda and sweet tea and has increased his water intake  Will order a sleep study Begin working on proper nutrition, hydration with water and improving sleep hygiene Consider use of topiramate for both migraine prevention and weight reduction

## 2023-05-22 LAB — CBC
Hematocrit: 54.2 % — ABNORMAL HIGH (ref 37.5–51.0)
Hemoglobin: 17.9 g/dL — ABNORMAL HIGH (ref 13.0–17.7)
MCH: 29 pg (ref 26.6–33.0)
MCHC: 33 g/dL (ref 31.5–35.7)
MCV: 88 fL (ref 79–97)
Platelets: 183 10*3/uL (ref 150–450)
RBC: 6.17 x10E6/uL — ABNORMAL HIGH (ref 4.14–5.80)
RDW: 12.9 % (ref 11.6–15.4)
WBC: 6.6 10*3/uL (ref 3.4–10.8)

## 2023-05-22 LAB — HEMOGLOBIN A1C
Est. average glucose Bld gHb Est-mCnc: 100 mg/dL
Hgb A1c MFr Bld: 5.1 % (ref 4.8–5.6)

## 2023-05-22 LAB — VITAMIN D 25 HYDROXY (VIT D DEFICIENCY, FRACTURES): Vit D, 25-Hydroxy: 22.4 ng/mL — ABNORMAL LOW (ref 30.0–100.0)

## 2023-05-22 LAB — VITAMIN B12: Vitamin B-12: 628 pg/mL (ref 232–1245)

## 2023-05-22 LAB — INSULIN, RANDOM: INSULIN: 18.7 u[IU]/mL (ref 2.6–24.9)

## 2023-05-22 LAB — FOLATE: Folate: 8.2 ng/mL (ref >3.0)

## 2023-05-26 ENCOUNTER — Telehealth: Payer: Self-pay | Admitting: *Deleted

## 2023-05-26 NOTE — Telephone Encounter (Signed)
Referral faxed to Dr. Bari Mantis office for sleep study.

## 2023-06-04 ENCOUNTER — Encounter: Payer: Self-pay | Admitting: Family Medicine

## 2023-06-04 ENCOUNTER — Ambulatory Visit (INDEPENDENT_AMBULATORY_CARE_PROVIDER_SITE_OTHER): Payer: 59 | Admitting: Family Medicine

## 2023-06-04 VITALS — BP 133/89 | HR 94 | Temp 98.0°F | Ht 71.0 in | Wt 276.0 lb

## 2023-06-04 DIAGNOSIS — E559 Vitamin D deficiency, unspecified: Secondary | ICD-10-CM

## 2023-06-04 DIAGNOSIS — G8929 Other chronic pain: Secondary | ICD-10-CM

## 2023-06-04 DIAGNOSIS — G4730 Sleep apnea, unspecified: Secondary | ICD-10-CM | POA: Diagnosis not present

## 2023-06-04 DIAGNOSIS — D582 Other hemoglobinopathies: Secondary | ICD-10-CM

## 2023-06-04 DIAGNOSIS — E88819 Insulin resistance, unspecified: Secondary | ICD-10-CM

## 2023-06-04 DIAGNOSIS — M545 Low back pain, unspecified: Secondary | ICD-10-CM

## 2023-06-04 DIAGNOSIS — E66812 Obesity, class 2: Secondary | ICD-10-CM

## 2023-06-04 DIAGNOSIS — Z6838 Body mass index (BMI) 38.0-38.9, adult: Secondary | ICD-10-CM

## 2023-06-04 MED ORDER — VITAMIN D3 125 MCG (5000 UT) PO CAPS: 5000.0000 [IU] | ORAL_CAPSULE | Freq: Every day | ORAL | Status: DC

## 2023-06-04 NOTE — Assessment & Plan Note (Signed)
He met with Dr Jerre Simon 06/02/23, notes reviewed Allergy testing is set up for next week and he plans to proceed with a sleep study in the near future  He has been working on improving sleep quality and time Continue active plan for weight reduction

## 2023-06-04 NOTE — Assessment & Plan Note (Signed)
New.  Reviewed labs with patient Fasting insulin elevated at 18.7 We discussed how IR can lead to diabetes, weight gain and cardiometabolic disease. He has never used metformin He has recently reduced his high intake of added sugar and has reduced starch intake He has increased walking time  Continue to work on these lifestyle changes with plans to add on resistance training 2 days/ wk Repeat lab in 4 mos

## 2023-06-04 NOTE — Patient Instructions (Signed)
Begin vitamin D3 + K2  5,000 international units of vitamin D daily  Continue to work on meal plan Plan to add in breakfast daily  Aim for 30 min of walking 5 days/ wk  Plan to have sleep study done with Dr Jerre Simon  Work on hydrating well with water (urine should be a pale yellow)  KEEP UP THE GOOD WORK!

## 2023-06-04 NOTE — Progress Notes (Signed)
Office: [phone]  /  Fax: [phone]  WEIGHT SUMMARY AND BIOMETRICS  Starting Date: 05/20/22  Starting Weight: 287lb   Weight Lost Since Last Visit: 11lb   Vitals Temp: 98 F (36.7 C) BP: 133/89 Pulse Rate: 94 SpO2: 96 %   Body Composition  Body Fat %: 36.9 % Fat Mass (lbs): 102 lbs Muscle Mass (lbs): 166 lbs Total Body Water (lbs): 121.6 lbs Visceral Fat Rating : 20     HPI  Chief Complaint: OBESITY  Chris Mcconnell is here to discuss his progress with his obesity treatment plan. He is on the the Category 4 Plan and states he is following his eating plan approximately 50-60 % of the time. He states he is exercising 30 minutes 3-4 times per week.    Interval History:  Since last office visit he is down 11 lb He has been working on Cat 4 meal plan He is struggling to get in breakfast due to being in a hurry and not being hungry He ordered a lunch box that has portion controlled contrainer He is off of SSBs.  He is down to drinking water and unsweet tea He has had some stress but made mindful choices He plans to pack breakfast and lunch to bring to work He has also done well on the weekends He has done therapy to deal with his ex He is walking outdoors 3-4 x a week  Pharmacotherapy: none  PHYSICAL EXAM:  Blood pressure 133/89, pulse 94, temperature 98 F (36.7 C), height 5\' 11"  (1.803 m), weight 276 lb (125.2 kg), SpO2 96%. Body mass index is 38.49 kg/m.  General: He is overweight, cooperative, alert, well developed, and in no acute distress. PSYCH: Has normal mood, affect and thought process.   Lungs: Normal breathing effort, no conversational dyspnea.  ASSESSMENT AND PLAN  TREATMENT PLAN FOR OBESITY:  Recommended Dietary Goals  Chris Mcconnell is currently in the action stage of change. As such, his goal is to continue weight management plan. He has agreed to the Category 4 Plan.  Behavioral Intervention  We discussed the following Behavioral Modification  Strategies today: increasing lean protein intake to established goals, increasing fiber rich foods, increasing water intake , work on meal planning and preparation, keeping healthy foods at home, identifying sources and decreasing liquid calories, decreasing eating out or consumption of processed foods, and making healthy choices when eating convenient foods, practice mindfulness eating and understand the difference between hunger signals and cravings, work on managing stress, creating time for self-care and relaxation, avoiding temptations and identifying enticing environmental cues, continue to work on implementation of reduced calorie nutritional plan, and continue to work on maintaining a reduced calorie state, getting the recommended amount of protein, incorporating whole foods, making healthy choices, staying well hydrated and practicing mindfulness when eating..  Additional resources provided today: NA  Recommended Physical Activity Goals  Chris Mcconnell has been advised to work up to 150 minutes of moderate intensity aerobic activity a week and strengthening exercises 2-3 times per week for cardiovascular health, weight loss maintenance and preservation of muscle mass.   He has agreed to Start aerobic activity with a goal of 150 minutes a week at moderate intensity.   Pharmacotherapy changes for the treatment of obesity: none  ASSOCIATED CONDITIONS ADDRESSED TODAY  Sleep-disordered breathing Assessment & Plan: He met with Dr Jerre Simon 06/02/23, notes reviewed Allergy testing is set up for next week and he plans to proceed with a sleep study in the near future  He has been  working on improving sleep quality and time Continue active plan for weight reduction   Class 2 obesity due to excess calories with body mass index (BMI) of 38.0 to 38.9 in adult, unspecified whether serious comorbidity present  Chronic midline low back pain without sciatica Assessment & Plan: Back pain is unchanged with the  addition of outdoor walking 30 min 3-4 x a week He is not needing to take RX or OTC pain meds He has started to lose weight with prescribed dietary plan creating calorie restriction  Increase walking time to 30 min 5 days wk adding in gentle back stretching   Elevated hemoglobin (HCC) Assessment & Plan: Lab Results  Component Value Date   WBC 6.6 05/21/2023   HGB 17.9 (H) 05/21/2023   HCT 54.2 (H) 05/21/2023   MCV 88 05/21/2023   PLT 183 05/21/2023   Reviewed lab results with patient Hgb and Hct remain elevated with no previous hx of hemochromatosis  He has not smoked a cigar in > 1 year and denies cigarette smoking He feels like he doesn't drink enough water  Increase water intake to 100+ oz/ day Can add sugar free electrolyte packet once a day Repeat CBC in 2 mos.  If not improving, refer to hematology for further evalutation   Vitamin D deficiency Assessment & Plan: Last vitamin D Lab Results  Component Value Date   VD25OH 22.4 (L) 05/21/2023   New Reviewed lab results with patient He is not taking a vitamin D supplement and does c/o fatigue and 'craving milk'  Begin OTC vitamin D3 + K2 with 5,000 international units  of vitamin D daily Repeat lab in 4 mos  Orders: -     Vitamin D3; Take 1 capsule (5,000 Units total) by mouth daily.  Insulin resistance Assessment & Plan: New.  Reviewed labs with patient Fasting insulin elevated at 18.7 We discussed how IR can lead to diabetes, weight gain and cardiometabolic disease. He has never used metformin He has recently reduced his high intake of added sugar and has reduced starch intake He has increased walking time  Continue to work on these lifestyle changes with plans to add on resistance training 2 days/ wk Repeat lab in 4 mos       He was informed of the importance of frequent follow up visits to maximize his success with intensive lifestyle modifications for his multiple health conditions.   ATTESTASTION  STATEMENTS:  Reviewed by clinician on day of visit: allergies, medications, problem list, medical history, surgical history, family history, social history, and previous encounter notes pertinent to obesity diagnosis.   I have personally spent 30 minutes total time today in preparation, patient care, nutritional counseling and documentation for this visit, including the following: review of clinical lab tests; review of medical tests/procedures/services.      Glennis Brink, DO DABFM, DABOM [facility] [address] [place], [place] [address] [phone]

## 2023-06-04 NOTE — Assessment & Plan Note (Signed)
Last vitamin D Lab Results  Component Value Date   VD25OH 22.4 (L) 05/21/2023   New Reviewed lab results with patient He is not taking a vitamin D supplement and does c/o fatigue and 'craving milk'  Begin OTC vitamin D3 + K2 with 5,000 international units  of vitamin D daily Repeat lab in 4 mos

## 2023-06-04 NOTE — Assessment & Plan Note (Signed)
Back pain is unchanged with the addition of outdoor walking 30 min 3-4 x a week He is not needing to take RX or OTC pain meds He has started to lose weight with prescribed dietary plan creating calorie restriction  Increase walking time to 30 min 5 days wk adding in gentle back stretching

## 2023-06-04 NOTE — Assessment & Plan Note (Signed)
Lab Results  Component Value Date   WBC 6.6 05/21/2023   HGB 17.9 (H) 05/21/2023   HCT 54.2 (H) 05/21/2023   MCV 88 05/21/2023   PLT 183 05/21/2023   Reviewed lab results with patient Hgb and Hct remain elevated with no previous hx of hemochromatosis  He has not smoked a cigar in > 1 year and denies cigarette smoking He feels like he doesn't drink enough water  Increase water intake to 100+ oz/ day Can add sugar free electrolyte packet once a day Repeat CBC in 2 mos.  If not improving, refer to hematology for further evalutation

## 2023-06-19 ENCOUNTER — Other Ambulatory Visit: Payer: Self-pay | Admitting: Internal Medicine

## 2023-06-19 DIAGNOSIS — F320 Major depressive disorder, single episode, mild: Secondary | ICD-10-CM

## 2023-06-30 ENCOUNTER — Ambulatory Visit (INDEPENDENT_AMBULATORY_CARE_PROVIDER_SITE_OTHER): Payer: 59 | Admitting: Family Medicine

## 2023-06-30 ENCOUNTER — Encounter: Payer: Self-pay | Admitting: Family Medicine

## 2023-06-30 VITALS — BP 115/78 | HR 104 | Temp 98.3°F | Ht 71.0 in | Wt 267.0 lb

## 2023-06-30 DIAGNOSIS — E88819 Insulin resistance, unspecified: Secondary | ICD-10-CM

## 2023-06-30 DIAGNOSIS — G4730 Sleep apnea, unspecified: Secondary | ICD-10-CM

## 2023-06-30 DIAGNOSIS — E66812 Obesity, class 2: Secondary | ICD-10-CM | POA: Diagnosis not present

## 2023-06-30 DIAGNOSIS — Z6837 Body mass index (BMI) 37.0-37.9, adult: Secondary | ICD-10-CM

## 2023-06-30 DIAGNOSIS — D582 Other hemoglobinopathies: Secondary | ICD-10-CM | POA: Diagnosis not present

## 2023-06-30 NOTE — Progress Notes (Signed)
 Office: [phone]  /  Fax: [phone]  WEIGHT SUMMARY AND BIOMETRICS  Starting Date: 05/20/22  Starting Weight: 287lb   Weight Lost Since Last Visit: 9lb   Vitals Temp: 98.3 F (36.8 C) BP: 115/78 Pulse Rate: (!) 104 SpO2: 96 %   Body Composition  Body Fat %: 36 % Fat Mass (lbs): 96.2 lbs Muscle Mass (lbs): 162.8 lbs Total Body Water (lbs): 121.2 lbs Visceral Fat Rating : 19    HPI  Chief Complaint: OBESITY  Chris Mcconnell is here to discuss his progress with his obesity treatment plan. He is on the the Category 4 Plan and states he is following his eating plan approximately 80 % of the time. He states he is doing more around the house.   Interval History:  Since last office visit he is down 9 lb He is down 3.2 pounds of muscle mass and down 5.8 pounds of body fat since his last visit This gives him a net weight loss of 20 lb in 3 mos of medically supervised weight management This is a 6.9% total body weight loss without use of antiobesity medication He added more water with a Circul water bottle He has not yet added in exercise He has access to a gym at work He works from home most days of the week He has replaced breakfast on plan with a Premier protein shake He went through allergy testing with Dr. Jerre Simon He is planning to pursue a home sleep study  Pharmacotherapy: None  PHYSICAL EXAM:  Blood pressure 115/78, pulse (!) 104, temperature 98.3 F (36.8 C), height 5\' 11"  (1.803 m), weight 267 lb (121.1 kg), SpO2 96%. Body mass index is 37.24 kg/m.  General: He is overweight, cooperative, alert, well developed, and in no acute distress. PSYCH: Has normal mood, affect and thought process.   Lungs: Normal breathing effort, no conversational dyspnea.   ASSESSMENT AND PLAN  TREATMENT PLAN FOR OBESITY:  Recommended Dietary Goals  Chris Mcconnell is currently in the action stage of change. As such, his goal is to continue weight management plan. He has agreed to the  Category 4 Plan. He may continue replacing breakfast with a Premier protein shake Recommend having 1 fresh fruit serving with this in the morning Answered his questions about old-fashioned oats, whole-grain pancake mix  Behavioral Intervention  We discussed the following Behavioral Modification Strategies today: increasing lean protein intake to established goals, increasing fiber rich foods, increasing water intake , work on meal planning and preparation, keeping healthy foods at home, work on managing stress, creating time for self-care and relaxation, avoiding temptations and identifying enticing environmental cues, planning for success, and continue to work on maintaining a reduced calorie state, getting the recommended amount of protein, incorporating whole foods, making healthy choices, staying well hydrated and practicing mindfulness when eating..  Additional resources provided today: NA  Recommended Physical Activity Goals  Chris Mcconnell has been advised to work up to 150 minutes of moderate intensity aerobic activity a week and strengthening exercises 2-3 times per week for cardiovascular health, weight loss maintenance and preservation of muscle mass.   He has agreed to [employer] strengthening exercises with a goal of 2-3 sessions a week  and Start aerobic activity with a goal of 150 minutes a week at moderate intensity.   Pharmacotherapy changes for the treatment of obesity: None  ASSOCIATED CONDITIONS ADDRESSED TODAY  Insulin resistance Last fasting insulin high at 18.7 He is doing well with weight reduction with a reduced calorie prescribed dietary plan,  reducing added sugar and refined carbohydrates And yet added regular exercise so we discussed a plan to implement both cardio and resistance training at least 3 days a week He has never used metformin  Plan to repeat fasting insulin in the next 2 months Added both cardio and resistance training exercise for total of 60 minutes 3 days a  week, using work gym  Class 2 severe obesity due to excess calories with serious comorbidity and body mass index (BMI) of 37.0 to 37.9 in adult [facility])  Sleep-disordered breathing He has met with Dr. Jerre Simon and is planning to move forward with a home sleep study Will follow-up results and continue active plan for weight reduction  Elevated hemoglobin (HCC) He denies a history of hemochromatosis or smoking.  He has struggled to hydrate well with water.  His last hemoglobin was 17.7 on initial labs.  Will plan to repeat CBC in 2 months (with vitamin D and fasting insulin and lipid panel)    He was informed of the importance of frequent follow up visits to maximize his success with intensive lifestyle modifications for his multiple health conditions.   ATTESTASTION STATEMENTS:  Reviewed by clinician on day of visit: allergies, medications, problem list, medical history, surgical history, family history, social history, and previous encounter notes pertinent to obesity diagnosis.   I have personally spent 30 minutes total time today in preparation, patient care, nutritional counseling and documentation for this visit, including the following: review of clinical lab tests; review of medical tests/procedures/services.      Glennis Brink, DO DABFM, DABOM [facility] [address] [place], [place] [address] [phone]

## 2023-07-29 ENCOUNTER — Encounter: Payer: Self-pay | Admitting: Family Medicine

## 2023-07-29 ENCOUNTER — Ambulatory Visit (INDEPENDENT_AMBULATORY_CARE_PROVIDER_SITE_OTHER): Payer: 59 | Admitting: Family Medicine

## 2023-07-29 VITALS — BP 119/77 | HR 73 | Temp 97.4°F | Ht 71.0 in | Wt 264.0 lb

## 2023-07-29 DIAGNOSIS — G8929 Other chronic pain: Secondary | ICD-10-CM | POA: Diagnosis not present

## 2023-07-29 DIAGNOSIS — M791 Myalgia, unspecified site: Secondary | ICD-10-CM

## 2023-07-29 DIAGNOSIS — G4733 Obstructive sleep apnea (adult) (pediatric): Secondary | ICD-10-CM | POA: Diagnosis not present

## 2023-07-29 DIAGNOSIS — R519 Headache, unspecified: Secondary | ICD-10-CM | POA: Diagnosis not present

## 2023-07-29 DIAGNOSIS — E66812 Obesity, class 2: Secondary | ICD-10-CM

## 2023-07-29 DIAGNOSIS — F5104 Psychophysiologic insomnia: Secondary | ICD-10-CM | POA: Diagnosis not present

## 2023-07-29 DIAGNOSIS — Z6836 Body mass index (BMI) 36.0-36.9, adult: Secondary | ICD-10-CM

## 2023-07-29 MED ORDER — TOPIRAMATE ER 25 MG PO CAP24: 1.0000 | ORAL_CAPSULE | Freq: Every day | ORAL | 0 refills | Status: DC

## 2023-07-29 NOTE — Progress Notes (Signed)
 Office: [phone]  /  Fax: [phone]  WEIGHT SUMMARY AND BIOMETRICS  Starting Date: 05/20/22  Starting Weight: 287lb   Weight Lost Since Last Visit: 3lb   Vitals Temp: (!) 97.4 F (36.3 C) BP: 119/77 Pulse Rate: 73 SpO2: 97 %   Body Composition  Body Fat %: 35.4 % Fat Mass (lbs): 93.8 lbs Muscle Mass (lbs): 162.6 lbs Total Body Water (lbs): 117.6 lbs Visceral Fat Rating : 18     HPI  Chief Complaint: OBESITY  Chris Mcconnell is here to discuss his progress with his obesity treatment plan. He is on the the Category 4 Plan and states he is following his eating plan approximately 80 % of the time. He states he is exercising 30-60 minutes 2 times per week.   Interval History:  Since last office visit he is down 3 lb This gives him a net weight loss of 23 lb in 2 mos This is an 8% total body weight loss He has cut back on eating out and is mindful of his food choices and portion sizes He is down 0.2 lb of muscle mass and 2.4 lb of body fat He had an episode of feeling lightheaded while walking on treadmill a week ago He denies dehydration or meal skipping He had been having muscle aches and pains last week without working out He plans to resume his gym workouts and added outdoor play time with his son  Pharmacotherapy: None  PHYSICAL EXAM:  Blood pressure 119/77, pulse 73, temperature (!) 97.4 F (36.3 C), height 5\' 11"  (1.803 m), weight 264 lb (119.7 kg), SpO2 97%. Body mass index is 36.82 kg/m.  General: He is overweight, cooperative, alert, well developed, and in no acute distress. PSYCH: Has normal mood, affect and thought process.   Lungs: Normal breathing effort, no conversational dyspnea.   ASSESSMENT AND PLAN  TREATMENT PLAN FOR OBESITY:  Recommended Dietary Goals  Chris Mcconnell is currently in the action stage of change. As such, his goal is to continue weight management plan. He has agreed to the Category 4 Plan.  Behavioral Intervention  We  discussed the following Behavioral Modification Strategies today: increasing lean protein intake to established goals, increasing fiber rich foods, increasing water intake , keeping healthy foods at home, identifying sources and decreasing liquid calories, practice mindfulness eating and understand the difference between hunger signals and cravings, work on managing stress, creating time for self-care and relaxation, avoiding temptations and identifying enticing environmental cues, continue to work on implementation of reduced calorie nutritional plan, planning for success, and continue to work on maintaining a reduced calorie state, getting the recommended amount of protein, incorporating whole foods, making healthy choices, staying well hydrated and practicing mindfulness when eating..  Additional resources provided today: NA  Recommended Physical Activity Goals  Chris Mcconnell has been advised to work up to 150 minutes of moderate intensity aerobic activity a week and strengthening exercises 2-3 times per week for cardiovascular health, weight loss maintenance and preservation of muscle mass.   He has agreed to Increase the intensity, frequency or duration of strengthening exercises  and Increase the intensity, frequency or duration of aerobic exercises   Aim for 3 days a week of gym workouts, adding in outdoor play time  Pharmacotherapy changes for the treatment of obesity: Begin Trokendi XR 25 mg once daily He denies a history of nephrolithiasis We reviewed potential adverse side effects  ASSOCIATED CONDITIONS ADDRESSED TODAY  Chronic nonintractable headache, unspecified headache type Chris Mcconnell does have a history of  chronic daily headaches.  He was recently diagnosed with mild OSA and has not been any changes in headaches since starting CPAP.  He has improved his food choices and hydration status.  He still has daily headaches.  He has never been on topiramate.  He has reduced his intake of  caffeine.  Begin Trokendi ER 25 mg each morning -     Topiramate ER; Take 1 capsule (25 mg total) by mouth daily.  Dispense: 30 capsule; Refill: 0  Class 2 severe obesity due to excess calories with serious comorbidity and body mass index (BMI) of 36.0 to 36.9 in adult (HCC)  OSA on CPAP Reviewed results of his recent sleep study.  He has been seen by Camelia Eng, NP.  Polysomnogram notes reviewed with an apnea-hypopnea index of 13.  He has recently started CPAP.  He is only averaging about 4 hours of sleep at night.  Continue active plan for weight reduction.  Follow-up with lung and sleep wellness.  Chronic insomnia Unchanged.  He was started on Lunesta 1 mg at bedtime by Camelia Eng NP He notes continued difficulty falling asleep at night and still averaging only about 4 hours of sleep at night with CPAP.  He does occasionally have a night of good sleep.  He denies any acute stressors.  He has chronic daily stress from work.  He has not seen much improvement in sleep with the addition of exercise.  He limits his caffeine intake.  Keep follow-up visit with lung and sleep wellness for chronic insomnia.  Continue to work on healthy lifestyle changes.  Myalgia He did have a brief period  Of muscle aches and pains with lightheadedness while working out for 1 day.  He denies any fever, chills or stomach upset.  He denies feeling dehydrated or feels skipping.  He is now back to feeling normal.  He is ready to get back to the gym.  Asked him to keep me posted if he has any known workout related myalgias with associated dizziness again.  He may add in some sugar-free electrolytes each day.  Avoid meal skipping.     He was informed of the importance of frequent follow up visits to maximize his success with intensive lifestyle modifications for his multiple health conditions.   ATTESTASTION STATEMENTS:  Reviewed by clinician on day of visit: allergies, medications, problem list, medical  history, surgical history, family history, social history, and previous encounter notes pertinent to obesity diagnosis.   I have personally spent 30 minutes total time today in preparation, patient care, nutritional counseling and documentation for this visit, including the following: review of clinical lab tests; review of medical tests/procedures/services.      Glennis Brink, DO DABFM, DABOM [facility] [address] [place], [place] [address] [phone]

## 2023-08-27 ENCOUNTER — Ambulatory Visit (INDEPENDENT_AMBULATORY_CARE_PROVIDER_SITE_OTHER): Admitting: Family Medicine

## 2023-08-27 ENCOUNTER — Encounter: Payer: Self-pay | Admitting: Family Medicine

## 2023-08-27 VITALS — BP 118/77 | HR 75 | Temp 98.0°F | Ht 71.0 in | Wt 264.0 lb

## 2023-08-27 DIAGNOSIS — G8929 Other chronic pain: Secondary | ICD-10-CM

## 2023-08-27 DIAGNOSIS — R519 Headache, unspecified: Secondary | ICD-10-CM | POA: Diagnosis not present

## 2023-08-27 DIAGNOSIS — E559 Vitamin D deficiency, unspecified: Secondary | ICD-10-CM | POA: Diagnosis not present

## 2023-08-27 DIAGNOSIS — E66812 Obesity, class 2: Secondary | ICD-10-CM

## 2023-08-27 DIAGNOSIS — E6609 Other obesity due to excess calories: Secondary | ICD-10-CM

## 2023-08-27 DIAGNOSIS — F5104 Psychophysiologic insomnia: Secondary | ICD-10-CM

## 2023-08-27 DIAGNOSIS — Z6836 Body mass index (BMI) 36.0-36.9, adult: Secondary | ICD-10-CM

## 2023-08-27 DIAGNOSIS — E785 Hyperlipidemia, unspecified: Secondary | ICD-10-CM

## 2023-08-27 DIAGNOSIS — G4733 Obstructive sleep apnea (adult) (pediatric): Secondary | ICD-10-CM | POA: Diagnosis not present

## 2023-08-27 MED ORDER — TOPIRAMATE ER 25 MG PO CAP24
1.0000 | ORAL_CAPSULE | Freq: Every day | ORAL | 1 refills | Status: DC
Start: 2023-08-27 — End: 2023-09-30

## 2023-08-27 NOTE — Progress Notes (Signed)
 Office: [phone]  /  Fax: [phone]  WEIGHT SUMMARY AND BIOMETRICS  Starting Date: 05/20/22  Starting Weight: 287lb   Weight Lost Since Last Visit: 0lb   Vitals Temp: 98 F (36.7 C) BP: 118/77 Pulse Rate: 75 SpO2: 96 %   Body Composition  Body Fat %: 35.7 % Fat Mass (lbs): 94.6 lbs Muscle Mass (lbs): 161.8 lbs Total Body Water (lbs): 118.4 lbs Visceral Fat Rating : 18   HPI  Chief Complaint: OBESITY  Chris Mcconnell is here to discuss his progress with his obesity treatment plan. He is on the the Category 4 Plan and states he is following his eating plan approximately 50 % of the time. He states he is exercising 60 minutes 1 times per week.  Interval History:  Since last office visit he is down 0 lb  This gives him a net weight loss of 23 in the past 3 mos of medically supervised weight management This is an 8% total body weight loss He is still mindful of meals out at lunch and is having a protein shake for breakfast each day For dinners, he has been eating more fast food with poor food choices Sodas now taste bad with the new start of Trokendi  He has improvements in food impulse control and portions on Trokendi  He did celebrate Easter but did not over- indulge in sweets Back pain is improving Still struggling with sleep- seeing [facility]  Pharmacotherapy: Trokendi  XR 25 mg daily  PHYSICAL EXAM:  Blood pressure 118/77, pulse 75, temperature 98 F (36.7 C), height 5\' 11"  (1.803 m), weight 264 lb (119.7 kg), SpO2 96%. Body mass index is 36.82 kg/m.  General: He is overweight, cooperative, alert, well developed, and in no acute distress. PSYCH: Has normal mood, affect and thought process.   Lungs: Normal breathing effort, no conversational dyspnea.   ASSESSMENT AND PLAN  TREATMENT PLAN FOR OBESITY:  Recommended Dietary Goals  Reynold is currently in the action stage of change. As such, his goal is to continue weight management plan. He has  agreed to the Category 4 Plan and practicing portion control and making smarter food choices, such as increasing vegetables and decreasing simple carbohydrates.  Behavioral Intervention  We discussed the following Behavioral Modification Strategies today: increasing lean protein intake to established goals, increasing fiber rich foods, increasing water intake , work on meal planning and preparation, keeping healthy foods at home, work on managing stress, creating time for self-care and relaxation, avoiding temptations and identifying enticing environmental cues, continue to practice mindfulness when eating, planning for success, and continue to work on maintaining a reduced calorie state, getting the recommended amount of protein, incorporating whole foods, making healthy choices, staying well hydrated and practicing mindfulness when eating..  Additional resources provided today: NA  Recommended Physical Activity Goals  [name] has been advised to work up to 150 minutes of moderate intensity aerobic activity a week and strengthening exercises 2-3 times per week for cardiovascular health, weight loss maintenance and preservation of muscle mass.   He has agreed to Increase the intensity, frequency or duration of aerobic exercises    Pharmacotherapy changes for the treatment of obesity: None  ASSOCIATED CONDITIONS ADDRESSED TODAY  OSA on CPAP Reviewed notes from Sandie Cross, PA/9/25.  He reports good compliance with CPAP nightly. He still struggles with chronic insomnia.  He has not responded well to melatonin or Lunesta.  He is trying magnesium L-Threonate, not yet started.    Continue to work on sleep  hygiene.  Continue CPAP nightly with a goal of 7 to 8 hours of sleep at night.  Continue active plan for weight loss.  Class 2 obesity due to excess calories with body mass index (BMI) of 36.0 to 36.9 in adult, unspecified whether serious comorbidity present -     Comprehensive metabolic panel  with GFR -     Insulin , random  Chronic nonintractable headache, unspecified headache type Improving.  He is still having frequent headaches but severity of headaches have improved with the new start Trokendi  XR 25 mg once daily which is also helping with food impulse control and cravings.  He denies adverse side effects from Trokendi . Update labs today to include chemistry panel. -     Topiramate  ER; Take 1 capsule (25 mg total) by mouth daily.  Dispense: 30 capsule; Refill: 1  Chronic insomnia Keep upcoming follow-up visit with [facility].  Continue working on use of his CPAP nightly and sleep hygiene.  Look for improvements with the addition of magnesium.  Vitamin D  deficiency Last vitamin D  Lab Results  Component Value Date   VD25OH 22.4 (L) 05/21/2023  He has been taking vitamin D  OTC 5000 IU daily.  Energy level is starting to improve.  Repeat vitamin D  level today with a goal over 50  -     VITAMIN D  25 Hydroxy (Vit-D Deficiency, Fractures)  Dyslipidemia Lab Results  Component Value Date   CHOL 185 03/23/2023   HDL 34.80 (L) 03/23/2023   LDLCALC 120 (H) 03/23/2023   TRIG 151.0 (H) 03/23/2023   CHOLHDL 5 03/23/2023  He has done well on a low saturated fat diet with fair adherence.  He has added more regular physical activity and has lost 8% of his total body weight in the past 3 months of medically supervised weight management.  He is currently not on any lipid-lowering medication.  Repeat fasting lipid panel today -     Lipid panel      He was informed of the importance of frequent follow up visits to maximize his success with intensive lifestyle modifications for his multiple health conditions.   ATTESTASTION STATEMENTS:  Reviewed by clinician on day of visit: allergies, medications, problem list, medical history, surgical history, family history, social history, and previous encounter notes pertinent to obesity diagnosis.   I have personally spent 30  minutes total time today in preparation, patient care, nutritional counseling and education,  and documentation for this visit, including the following: review of most recent clinical lab tests, prescribing medications/ refilling medications, reviewing medical assistant documentation, review and interpretation of bioimpedence results.     Micky Albee, D.O. DABFM, DABOM [facility] [address] [place], [place] [address] [phone]

## 2023-08-28 LAB — LIPID PANEL
Chol/HDL Ratio: 4.9 ratio (ref 0.0–5.0)
Cholesterol, Total: 152 mg/dL (ref 100–199)
HDL: 31 mg/dL — ABNORMAL LOW (ref >39)
LDL Chol Calc (NIH): 101 mg/dL — ABNORMAL HIGH (ref 0–99)
Triglycerides: 106 mg/dL (ref 0–149)
VLDL Cholesterol Cal: 20 mg/dL (ref 5–40)

## 2023-08-28 LAB — COMPREHENSIVE METABOLIC PANEL WITH GFR
ALT: 26 IU/L (ref 0–44)
AST: 17 IU/L (ref 0–40)
Albumin: 4.2 g/dL (ref 4.1–5.1)
Alkaline Phosphatase: 87 IU/L (ref 44–121)
BUN/Creatinine Ratio: 13 (ref 9–20)
BUN: 14 mg/dL (ref 6–24)
Bilirubin Total: 0.8 mg/dL (ref 0.0–1.2)
CO2: 21 mmol/L (ref 20–29)
Calcium: 8.9 mg/dL (ref 8.7–10.2)
Chloride: 110 mmol/L — ABNORMAL HIGH (ref 96–106)
Creatinine, Ser: 1.1 mg/dL (ref 0.76–1.27)
Globulin, Total: 1.5 g/dL (ref 1.5–4.5)
Glucose: 87 mg/dL (ref 70–99)
Potassium: 4 mmol/L (ref 3.5–5.2)
Sodium: 143 mmol/L (ref 134–144)
Total Protein: 5.7 g/dL — ABNORMAL LOW (ref 6.0–8.5)
eGFR: 86 mL/min/{1.73_m2} (ref >59)

## 2023-08-28 LAB — INSULIN, RANDOM: INSULIN: 15.8 u[IU]/mL (ref 2.6–24.9)

## 2023-08-28 LAB — VITAMIN D 25 HYDROXY (VIT D DEFICIENCY, FRACTURES): Vit D, 25-Hydroxy: 40.2 ng/mL (ref 30.0–100.0)

## 2023-09-08 ENCOUNTER — Other Ambulatory Visit: Payer: Self-pay

## 2023-09-08 ENCOUNTER — Emergency Department (HOSPITAL_COMMUNITY)
Admission: EM | Admit: 2023-09-08 | Discharge: 2023-09-08 | Disposition: A | Discharge status: Home / Self Care | Attending: Emergency Medicine | Admitting: Emergency Medicine

## 2023-09-08 ENCOUNTER — Encounter (HOSPITAL_COMMUNITY): Payer: Self-pay

## 2023-09-08 DIAGNOSIS — K625 Hemorrhage of anus and rectum: Secondary | ICD-10-CM | POA: Diagnosis present

## 2023-09-08 DIAGNOSIS — Z91040 Latex allergy status: Secondary | ICD-10-CM | POA: Insufficient documentation

## 2023-09-08 LAB — COMPREHENSIVE METABOLIC PANEL WITH GFR
ALT: 33 U/L (ref 0–44)
AST: 20 U/L (ref 15–41)
Albumin: 4.2 g/dL (ref 3.5–5.0)
Alkaline Phosphatase: 61 U/L (ref 38–126)
Anion gap: 7 (ref 5–15)
BUN: 19 mg/dL (ref 6–20)
CO2: 25 mmol/L (ref 22–32)
Calcium: 8.9 mg/dL (ref 8.9–10.3)
Chloride: 106 mmol/L (ref 98–111)
Creatinine, Ser: 0.93 mg/dL (ref 0.61–1.24)
GFR, Estimated: >60 mL/min (ref >60)
Glucose, Bld: 98 mg/dL (ref 70–99)
Potassium: 4.2 mmol/L (ref 3.5–5.1)
Sodium: 138 mmol/L (ref 135–145)
Total Bilirubin: 0.9 mg/dL (ref 0.0–1.2)
Total Protein: 6.5 g/dL (ref 6.5–8.1)

## 2023-09-08 LAB — CBC
HCT: 49.4 % (ref 39.0–52.0)
Hemoglobin: 17.2 g/dL — ABNORMAL HIGH (ref 13.0–17.0)
MCH: 30 pg (ref 26.0–34.0)
MCHC: 34.8 g/dL (ref 30.0–36.0)
MCV: 86.1 fL (ref 80.0–100.0)
Platelets: 170 10*3/uL (ref 150–400)
RBC: 5.74 MIL/uL (ref 4.22–5.81)
RDW: 12.5 % (ref 11.5–15.5)
WBC: 7.3 10*3/uL (ref 4.0–10.5)
nRBC: 0 % (ref 0.0–0.2)

## 2023-09-08 NOTE — ED Triage Notes (Signed)
 1 episode of bright red rectal bleeding today when having a BM. Pt reports toilet was full of blood. No blood thinners, no pain. Some nausea.

## 2023-09-08 NOTE — Discharge Instructions (Addendum)
 It was a pleasure taking care of you today! Today we evaluated the cause of your rectal bleeding. Your hemoglobin is stable at this time and based on history and physical exam the attending physician believes it is appropriate to have you follow-up with a GI specialist outpatient. I have attached the referral information for Dr. Lavaughn Portland at [facility]. Please call and make an appointment as soon as possible. Please return to the ED or seek further medical care if you experience any of the following symptoms including but not limited to severe rectal bleeding, severe abdominal pain, fever, chills, vomiting blood, or inability to use the bathroom. Please follow-up with your primary care doctor and GI as soon as possible for ongoing diagnosis and treatment.

## 2023-09-08 NOTE — ED Provider Notes (Signed)
 [place] [facility]ERGENCY DEPARTMENT AT [facility] Provider Note   CSN: [id number] Arrival date & time: 09/08/23  1126     History  Chief Complaint  Patient presents with   Rectal Bleeding    Chris Mcconnell is a 43 y.o. male who presents to the emergency department for bright red blood in stool.  Patient states that earlier this morning at approximately 10:30am he was having a bowel movement whenever he noticed that there was bright red blood in the toilet.  He also states that whenever he wiped there was also bright red blood on the toilet paper.  Patient denies blood thinners or known GI issues.  Patient states that he had a similar instance of this approximately 10 years ago which she came to the hospital for eventually had an outpatient colonoscopy completed with no diagnosis. Denies trauma. Past medical history only significant for depression and migraines, denies any GI diagnosis or symptoms other than today. Denies fever, chills, shortness of breath, chest pain, diarrhea, or vomiting.    Rectal Bleeding     Home Medications Prior to Admission medications   Medication Sig Start Date End Date Taking? Authorizing Provider  magnesium 30 MG tablet Take 30 mg by mouth daily.   Yes [provider]  buPROPion  (WELLBUTRIN  XL) 150 MG 24 hr tablet Take 1 tablet by mouth once daily 06/19/23   Gavin Kast, FNP  Cholecalciferol (VITAMIN D3) 125 MCG (5000 UT) CAPS Take 1 capsule (5,000 Units total) by mouth daily. 06/04/23   Bowen, Leeta Puls, DO  EPINEPHrine 0.3 mg/0.3 mL IJ SOAJ injection Inject into the muscle. 07/27/23   [provider]  fluticasone (FLONASE) 50 MCG/ACT nasal spray Place into the nose. Patient not taking: Reported on 08/27/2023 06/12/23 06/11/24  [provider]  LORATADINE CHILDRENS 5 MG/5ML syrup Take by mouth.    [provider]  Misc. Devices MISC Start AutoCPAP at 5-15 cm. water pressure.  Prefer Resmed S11 AutoCPAP machine with a  mask, supplies and heated tubing for mild OSA with AHI 13.  Mask of patient preference - please do new mask fitting.  Send to a local DME. 07/16/23   [provider]  Topiramate  ER (TROKENDI  XR) 25 MG CP24 Take 1 capsule (25 mg total) by mouth daily. 08/27/23   Bowen, Leeta Puls, DO      Allergies    Latex    Review of Systems   Review of Systems  Gastrointestinal:  Positive for hematochezia.    Physical Exam Updated Vital Signs BP (!) 152/89 (BP Location: Right Arm)   Pulse 91   Temp 98.1 F (36.7 C) (Oral)   Resp 15   Ht 5\' 11"  (1.803 m)   Wt 121.1 kg   SpO2 98%   BMI 37.24 kg/m  Physical Exam Vitals and nursing note reviewed.  Constitutional:      General: He is not in acute distress.    Appearance: Normal appearance. He is not ill-appearing or toxic-appearing.  HENT:     Head: Normocephalic and atraumatic.  Cardiovascular:     Rate and Rhythm: Normal rate and regular rhythm.  Pulmonary:     Effort: Pulmonary effort is normal. No respiratory distress.     Breath sounds: Normal breath sounds. No wheezing, rhonchi or rales.  Abdominal:     General: Abdomen is flat. Bowel sounds are normal. There is no distension.     Palpations: Abdomen is soft. There is no mass.     Tenderness:  There is no abdominal tenderness. There is no guarding or rebound.  Genitourinary:    Comments: Bright red blood per rectum on exam Musculoskeletal:        General: Normal range of motion.  Skin:    General: Skin is warm and dry.     Capillary Refill: Capillary refill takes less than 2 seconds.  Neurological:     General: No focal deficit present.     Mental Status: He is alert and oriented to person, place, and time.  Psychiatric:        Mood and Affect: Mood normal.        Behavior: Behavior normal.    ED Results / Procedures / Treatments   Labs (all labs ordered are listed, but only abnormal results are displayed) Labs Reviewed  CBC - Abnormal; Notable for the following  components:      Result Value   Hemoglobin 17.2 (*)    All other components within normal limits  COMPREHENSIVE METABOLIC PANEL WITH GFR  POC OCCULT BLOOD, ED    EKG None  Radiology No results found.  Procedures Procedures    Medications Ordered in ED Medications - No data to display  ED Course/ Medical Decision Making/ A&P    Patient presents to the ED for concern of bright red blood in stool, this involves an extensive number of treatment options, and is a complaint that carries with it a high risk of complications and morbidity.  The differential diagnosis includes Upper GI bleed, lower GI bleed, esophageal varices, hemorrhoids, ulcer, anal fissure, etc.    Co morbidities that complicate the patient evaluation  none   Lab Tests:  I Ordered, and personally interpreted labs.  The pertinent results include:  Hemoglobin 17.2    Medicines ordered and prescription drug management:  I have reviewed the patients home medicines and have made adjustments as needed   Test Considered:  CTA: declined due to stable hemoglobin, no presence of extensive active bleed from rectum, doubt extensive active GI bleed that would make the patient hemodynamically unstable Repeat CBC: spoke with attending physician who felt test was not needed, patient clinically stable   Problem List / ED Course:  Rectal bleeding   Reevaluation:  After the interventions noted above, I reevaluated the patient and found that they have :stayed the same   Social Determinants of Health:  none   Dispostion:  After consideration of the diagnostic results and the patients response to treatment, I feel that the patent would benefit from discharge and follow-up with PCP as well as GI. GI referral placed today, patient instructed to call number to make appointment. Patient should Please return to the ED or seek further medical care if you experience any of the following symptoms including but not  limited to severe rectal bleeding, severe abdominal pain, fever, chills, vomiting blood, or inability to use the bathroom. Patient hemodynamically stable at time of discharge with no symptoms other than bright red blood per rectum.  Click here for ABCD2, HEART and other calculatorsREFRESH Note before signing :1}                              Medical Decision Making Amount and/or Complexity of Data Reviewed Labs: ordered.          Final Clinical Impression(s) / ED Diagnoses Final diagnoses:  None    Rx / DC Orders ED Discharge Orders     None  Fonda Hymen, PA-C 09/08/23 1437    Deatra Face, MD 09/08/23 1549

## 2023-09-15 ENCOUNTER — Ambulatory Visit: Admitting: Internal Medicine

## 2023-09-21 ENCOUNTER — Ambulatory Visit: Payer: 59 | Admitting: Internal Medicine

## 2023-09-21 ENCOUNTER — Encounter: Payer: Self-pay | Admitting: Internal Medicine

## 2023-09-21 ENCOUNTER — Ambulatory Visit: Admitting: Internal Medicine

## 2023-09-21 VITALS — BP 110/66 | HR 95 | Temp 97.7°F | Ht 71.0 in | Wt 263.2 lb

## 2023-09-21 DIAGNOSIS — G4733 Obstructive sleep apnea (adult) (pediatric): Secondary | ICD-10-CM | POA: Diagnosis not present

## 2023-09-21 DIAGNOSIS — E559 Vitamin D deficiency, unspecified: Secondary | ICD-10-CM | POA: Diagnosis not present

## 2023-09-21 DIAGNOSIS — K625 Hemorrhage of anus and rectum: Secondary | ICD-10-CM | POA: Diagnosis not present

## 2023-09-21 DIAGNOSIS — H6123 Impacted cerumen, bilateral: Secondary | ICD-10-CM | POA: Diagnosis not present

## 2023-09-21 DIAGNOSIS — F320 Major depressive disorder, single episode, mild: Secondary | ICD-10-CM

## 2023-09-21 DIAGNOSIS — F325 Major depressive disorder, single episode, in full remission: Secondary | ICD-10-CM

## 2023-09-21 NOTE — Progress Notes (Signed)
 [facility] [address] [place] [address] Dept: [phone] Dept Fax: [phone]    Subjective:   Chris Mcconnell [birth year] 09/21/2023  Chief Complaint  Patient presents with   Follow-up    Left ear fullness     HPI: Chris Mcconnell presents today for re-assessment and management of chronic medical conditions.  Discussed the use of AI scribe software for clinical note transcription with the patient, who gave verbal consent to proceed.  History of Present Illness   Chris Mcconnell is a 43 year old male who presents for follow-up.  He is following up on his depression. He has not taken his Wellbutrin  for the past week due to a busy schedule but feels fine without it. He believes he no longer needs the medication as his mood is stable.  He has a history of sleep apnea and continues to use his CPAP machine regularly. He recently had blood work done, including electrolytes, kidney function, blood counts, and cholesterol. He is also taking a vitamin D  supplement but missed it last week along with his other medications.  Two weeks ago, he experienced rectal bleeding for about three days and visited the ER. The cause was not determined. He has a history of hemorrhoids but none were found during the ER visit. A similar episode occurred in 2016, which led to a colonoscopy that showed no abnormalities. He plans to see a GI specialist next month.  He reports left ear fullness and difficulty hearing, which he attributes to earwax buildup. He has been using a water sprayer to clear the wax but still experiences blockage, especially in the left ear.          09/21/2023    8:16 AM 03/23/2023    8:55 AM 11/18/2022    8:02 AM  Depression screen PHQ 2/9  Decreased Interest 1 2 0  Down, Depressed, Hopeless 0 1 0  PHQ - 2 Score 1 3 0  Altered sleeping 2 1   Tired, decreased energy 2 1   Change in appetite 0 2   Feeling bad or  failure about yourself  0 1   Trouble concentrating 1 0   Moving slowly or fidgety/restless 0 0   Suicidal thoughts 0 0   PHQ-9 Score 6 8   Difficult doing work/chores Not difficult at all Somewhat difficult        The following portions of the patient's history were reviewed and updated as appropriate: past medical history, past surgical history, family history, social history, allergies, medications, and problem list.   Patient Active Problem List   Diagnosis Date Noted   OSA on CPAP 07/29/2023   Chronic insomnia 07/29/2023   Myalgia 07/29/2023   Vitamin D  deficiency 06/04/2023   Insulin  resistance 06/04/2023   Chronic midline low back pain 05/21/2023   Chronic migraine without aura without status migrainosus, not intractable 05/21/2023   Sleep-disordered breathing 05/21/2023   Elevated hemoglobin (HCC) 05/21/2023   Class 3 severe obesity due to excess calories without serious comorbidity with body mass index (BMI) of 40.0 to 44.9 in adult 03/23/2023   Depression, major, single episode, mild (HCC) 10/07/2022   Obesity, Class II, BMI 35-39.9 03/06/2022   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 Devices MISC, Start AutoCPAP at 5-15 cm. water pressure.  Prefer Resmed S11 AutoCPAP machine with a mask, supplies and heated tubing for mild OSA with AHI 13.  Mask of patient preference - please do new mask fitting.  Send to a local  DME., Disp: , Rfl:    Topiramate  ER (TROKENDI  XR) 25 MG CP24, Take 1 capsule (25 mg total) by mouth daily., Disp: 30 capsule, Rfl: 1   Cholecalciferol (VITAMIN D3) 125 MCG (5000 UT) CAPS, Take 1 capsule (5,000 Units total) by mouth daily., Disp: , Rfl:    EPINEPHrine 0.3 mg/0.3 mL IJ SOAJ injection, Inject into the muscle., Disp: , Rfl:  Allergies  Allergen Reactions   Latex Other (See Comments)    Stomach pain     ROS: A complete ROS was performed with pertinent positives/negatives noted in the HPI. The remainder of the ROS are negative.    Objective:   Today's Vitals   09/21/23 0818  BP: 110/66  Pulse: 95  Temp: 97.7 F (36.5 C)  TempSrc: Temporal  SpO2: 97%  Weight: 263 lb 3.2 oz (119.4 kg)  Height: 5\' 11"  (1.803 m)    GENERAL: Well-appearing, in NAD. Well nourished.  SKIN: Pink, warm and dry.  HEENT:    HEAD: Normocephalic, non-traumatic.  EYES: Conjunctive pink without exudate. EARS: External ear w/o redness, swelling, masses, or lesions. EAC with cerumen impaction bilaterally.  RESPIRATORY: Chest wall symmetrical. Respirations even and non-labored. Breath sounds clear to auscultation bilaterally.  CARDIAC: S1, S2 present, regular rate and rhythm. Peripheral pulses 2+ bilaterally.  PSYCH/MENTAL STATUS: Alert, oriented x 3. Cooperative, appropriate mood and affect.   There are no preventive care reminders to display for this patient.  No results found for any visits on 09/21/23.  The ASCVD Risk score (Arnett DK, et al., 2019) failed to calculate for the following reasons:   Risk score cannot be calculated because patient has a medical history suggesting prior/existing ASCVD   Ceruminosis is noted to right and left ear.  Verbal consent obtained. Wax is removed by syringing and manual debridement. Instructions for home care to prevent wax buildup are given.     Assessment & Plan:  Assessment and Plan    Bilateral impacted cerumen Left ear fullness and hearing  difficulty due to cerumen impaction. Water sprayer attempts ineffective. - Perform elephant ear wash in office today.  Depression Feeling well without bupropion  for a week. Discussed option to restart if symptoms recur. - Discontinue bupropion .  Obstructive sleep apnea Continues CPAP therapy with no new symptoms. - Continue CPAP therapy.  Vitamin D  deficiency Vitamin D  levels adequate with supplementation. Plans to resume supplement after a week of non-use. - Continue once daily vitamin D  supplement.       No orders of the defined types were placed in this encounter.  No images are attached to the encounter or orders placed in the encounter. No orders of the defined types were placed in this encounter.   Return in about 6 months (around 03/23/2024) for Annual Physical Exam  with fasting lab work.   Gavin Kast, FNP

## 2023-09-21 NOTE — Patient Instructions (Addendum)
 Debrox drops - 5 drops into ear. Can use up to 2 times a day. Wait at least 30 minutes after inserting drops before flushing ear.

## 2023-09-30 ENCOUNTER — Encounter: Payer: Self-pay | Admitting: Family Medicine

## 2023-09-30 ENCOUNTER — Ambulatory Visit: Admitting: Family Medicine

## 2023-09-30 VITALS — BP 118/73 | HR 68 | Temp 98.1°F | Ht 71.0 in | Wt 263.0 lb

## 2023-09-30 DIAGNOSIS — E785 Hyperlipidemia, unspecified: Secondary | ICD-10-CM

## 2023-09-30 DIAGNOSIS — Z6837 Body mass index (BMI) 37.0-37.9, adult: Secondary | ICD-10-CM

## 2023-09-30 DIAGNOSIS — R519 Headache, unspecified: Secondary | ICD-10-CM

## 2023-09-30 DIAGNOSIS — G8929 Other chronic pain: Secondary | ICD-10-CM

## 2023-09-30 DIAGNOSIS — G4733 Obstructive sleep apnea (adult) (pediatric): Secondary | ICD-10-CM | POA: Diagnosis not present

## 2023-09-30 DIAGNOSIS — E66812 Obesity, class 2: Secondary | ICD-10-CM

## 2023-09-30 DIAGNOSIS — E88819 Insulin resistance, unspecified: Secondary | ICD-10-CM

## 2023-09-30 DIAGNOSIS — E559 Vitamin D deficiency, unspecified: Secondary | ICD-10-CM

## 2023-09-30 MED ORDER — TOPIRAMATE ER 25 MG PO CAP24: 1.0000 | ORAL_CAPSULE | Freq: Every day | ORAL | 1 refills | Status: DC

## 2023-09-30 NOTE — Progress Notes (Signed)
 Office: [phone]  /  Fax: [phone]  WEIGHT SUMMARY AND BIOMETRICS  Starting Date: 05/20/22  Starting Weight: 287lb   Weight Lost Since Last Visit: 1lb   Vitals Temp: 98.1 F (36.7 C) BP: 118/73 Pulse Rate: 68 SpO2: 98 %   Body Composition  Body Fat %: 35.1 % Fat Mass (lbs): 92.4 lbs Muscle Mass (lbs): 162.4 lbs Total Body Water (lbs): 118.2 lbs Visceral Fat Rating : 18    HPI  Chief Complaint: OBESITY  Chris Mcconnell is here to discuss his progress with his obesity treatment plan. He is on the the Category 4 Plan and states he is following his eating plan approximately 75 % of the time. He states he is exercising 30 minutes 2 times per week.  Interval History:  Since last office visit he is down 1 lb This gives him a net weight loss of 24 lb in 4 mos of medically supervised weight management This is an 8.3% total body weight loss He is traveling to the [employer] in June for vacation for 6 days He is up 0.6 lb of muscle mass and down 2.2 lb of body fat since last visit He has been wearing CPAP at night He plans on adding in gym workouts His dad and mom will be moving in with him soon He has cut back on eating out He remains mindful of food choices, lean protein intake and portion sizes  Pharmacotherapy: Trokendi  XR 25 mg daily  PHYSICAL EXAM:  Blood pressure 118/73, pulse 68, temperature 98.1 F (36.7 C), height 5\' 11"  (1.803 m), weight 263 lb (119.3 kg), SpO2 98%. Body mass index is 36.68 kg/m.  General: He is overweight, cooperative, alert, well developed, and in no acute distress. PSYCH: Has normal mood, affect and thought process.   Lungs: Normal breathing effort, no conversational dyspnea.   ASSESSMENT AND PLAN  TREATMENT PLAN FOR OBESITY:  Recommended Dietary Goals  Chris Mcconnell is currently in the action stage of change. As such, his goal is to continue weight management plan. He has agreed to the Category 4 Plan.  Behavioral Intervention  We  discussed the following Behavioral Modification Strategies today: increasing lean protein intake to established goals, increasing fiber rich foods, increasing water intake , work on meal planning and preparation, keeping healthy foods at home, identifying sources and decreasing liquid calories, decreasing eating out or consumption of processed foods, and making healthy choices when eating convenient foods, practice mindfulness eating and understand the difference between hunger signals and cravings, work on managing stress, creating time for self-care and relaxation, avoiding temptations and identifying enticing environmental cues, planning for success, and continue to work on maintaining a reduced calorie state, getting the recommended amount of protein, incorporating whole foods, making healthy choices, staying well hydrated and practicing mindfulness when eating..  Additional resources provided today: NA  Recommended Physical Activity Goals  Chris Mcconnell has been advised to work up to 150 minutes of moderate intensity aerobic activity a week and strengthening exercises 2-3 times per week for cardiovascular health, weight loss maintenance and preservation of muscle mass.   He has agreed to [employer] strengthening exercises with a goal of 2-3 sessions a week  and Start aerobic activity with a goal of 150 minutes a week at moderate intensity.  Aim for weight training at the gym 2 days a week and 7000 steps per day  Pharmacotherapy changes for the treatment of obesity: None  ASSOCIATED CONDITIONS ADDRESSED TODAY  OSA on CPAP He reports good compliance using  CPAP nightly He has been getting less than 8 hours of sleep at night  Continue active plan for weight loss wearing CPAP at least 4 hours per night, followed by Dr. Javaid  Chronic nonintractable headache, unspecified headache type Headache frequency has improved on Trokendi  25 mg once daily.  He denies brain fog or other adverse side effects.  He has  enjoyed improvement in food impulse control. -     Topiramate  ER; Take 1 capsule (25 mg total) by mouth daily.  Dispense: 30 capsule; Refill: 1  Class 2 severe obesity due to excess calories with serious comorbidity and body mass index (BMI) of 37.0 to 37.9 in adult [facility])  Vitamin D  deficiency Last vitamin D  Lab Results  Component Value Date   VD25OH 40.2 08/27/2023   Reviewed labs with patient from last visit.  Vitamin D  level has improved on vitamin D  5000 IU once daily.  Energy level has improved.  Continue vitamin D  at 5000 IU once daily.  Repeat lab in 6 months.  Dyslipidemia Lab Results  Component Value Date   CHOL 152 08/27/2023   HDL 31 (L) 08/27/2023   LDLCALC 101 (H) 08/27/2023   TRIG 106 08/27/2023   CHOLHDL 4.9 08/27/2023  Reviewed lab with patient from last visit.  He has a chronically low HDL level.  We discussed importance of regular exercise including both cardio and resistance training with a goal of at least 3 days a week to help boost HDL levels.  Insulin  resistance Improving.  Reviewed labs with patient from last visit.  His fasting insulin  is just over 15, improved from previous reading.  He has done well with over 8% of the total body weight loss in the past 4 months medically supervised weight management.  He has reduced his intake of starches and sugar.  He has room for improvement with regular exercise.  Continue to work on healthy lifestyle changes.     He was informed of the importance of frequent follow up visits to maximize his success with intensive lifestyle modifications for his multiple health conditions.   ATTESTASTION STATEMENTS:  Reviewed by clinician on day of visit: allergies, medications, problem list, medical history, surgical history, family history, social history, and previous encounter notes pertinent to obesity diagnosis.   I have personally spent 30 minutes total time today in preparation, patient care, nutritional counseling and  education,  and documentation for this visit, including the following: review of most recent clinical lab tests, prescribing medications/ refilling medications, reviewing medical assistant documentation, review and interpretation of bioimpedence results.     Micky Albee, D.O. DABFM, DABOM [facility] [address] [place], [place] [address] [phone]

## 2023-10-28 ENCOUNTER — Ambulatory Visit: Admitting: Family Medicine

## 2023-11-11 ENCOUNTER — Encounter: Payer: Self-pay | Admitting: Family Medicine

## 2023-11-11 ENCOUNTER — Ambulatory Visit: Admitting: Family Medicine

## 2023-11-11 VITALS — BP 125/72 | HR 86 | Ht 71.0 in | Wt 262.0 lb

## 2023-11-11 DIAGNOSIS — G4733 Obstructive sleep apnea (adult) (pediatric): Secondary | ICD-10-CM | POA: Diagnosis not present

## 2023-11-11 DIAGNOSIS — R519 Headache, unspecified: Secondary | ICD-10-CM | POA: Diagnosis not present

## 2023-11-11 DIAGNOSIS — Z6836 Body mass index (BMI) 36.0-36.9, adult: Secondary | ICD-10-CM

## 2023-11-11 DIAGNOSIS — E559 Vitamin D deficiency, unspecified: Secondary | ICD-10-CM | POA: Diagnosis not present

## 2023-11-11 DIAGNOSIS — E66812 Obesity, class 2: Secondary | ICD-10-CM

## 2023-11-11 DIAGNOSIS — G8929 Other chronic pain: Secondary | ICD-10-CM

## 2023-11-11 MED ORDER — TOPIRAMATE ER 25 MG PO CAP24: 1.0000 | ORAL_CAPSULE | Freq: Every day | ORAL | 1 refills | Status: DC

## 2023-11-11 NOTE — Patient Instructions (Signed)
 Stay active! On the days that you are not doing yardwork, take a walk or do a home workout  Hydrate well with water Remember to buy your fruits and veggies  Limit the meals out to 2 per week Think of easy dinners to throw together: Healthy Choice Frozen meals Pre - bagged salads Rotisserie Chicken Pre cooked chicken Just Bare Nuggets Microwave frozen veggies

## 2023-11-11 NOTE — Progress Notes (Signed)
 Office: [phone]  /  Fax: [phone]  WEIGHT SUMMARY AND BIOMETRICS  Starting Date: 05/20/22  Starting Weight: 287lb   Weight Lost Since Last Visit: 1lb   Vitals BP: 125/72 Pulse Rate: 86 SpO2: 90 %   Body Composition  Body Fat %: 35.4 % Fat Mass (lbs): 92.8 lbs Muscle Mass (lbs): 160.8 lbs Total Body Water (lbs): 119.4 lbs Visceral Fat Rating : 18    HPI  Chief Complaint: OBESITY  Chris Mcconnell is here to discuss his progress with his obesity treatment plan. He is on the the Category 4 Plan and states he is following his eating plan approximately 70 % of the time. He states he is doing yard work and things around the house.   Interval History:  Since last office visit he is down 1 lb This gives him a net weight loss 25 lb in 5.5 mos This is a 8.7% TBW loss He did just get back from a week vacation at the [employer] His parents will be moving in with him and this has kept him busy He is often eating dinner later and eating out for dinner more often His lowest weight was 258 lb on his home scale He has been doing yardwork and has been building at home  His colonoscopy was updated at [facility] 6/25 showing hemorrhoids, no polyps  Pharmacotherapy: Trokendi  XR 25 mg daily  PHYSICAL EXAM:  Blood pressure 125/72, pulse 86, height 5' 11 (1.803 m), weight 262 lb (118.8 kg), SpO2 90%. Body mass index is 36.54 kg/m.  General: He is overweight, cooperative, alert, well developed, and in no acute distress. PSYCH: Has normal mood, affect and thought process.   Lungs: Normal breathing effort, no conversational dyspnea.  ASSESSMENT AND PLAN  TREATMENT PLAN FOR OBESITY:  Recommended Dietary Goals  Chris Mcconnell is currently in the action stage of change. As such, his goal is to continue weight management plan. He has agreed to the Category 4 Plan.  Behavioral Intervention  We discussed the following Behavioral Modification Strategies today: increasing lean protein intake  to established goals, increasing fiber rich foods, increasing water intake , work on meal planning and preparation, keeping healthy foods at home, identifying sources and decreasing liquid calories, work on managing stress, creating time for self-care and relaxation, avoiding temptations and identifying enticing environmental cues, planning for success, and continue to work on maintaining a reduced calorie state, getting the recommended amount of protein, incorporating whole foods, making healthy choices, staying well hydrated and practicing mindfulness when eating..  Additional resources provided today: NA  Recommended Physical Activity Goals  Chris Mcconnell has been advised to work up to 150 minutes of moderate intensity aerobic activity a week and strengthening exercises 2-3 times per week for cardiovascular health, weight loss maintenance and preservation of muscle mass.   He has agreed to [employer] strengthening exercises with a goal of 2-3 sessions a week  and Increase the intensity, frequency or duration of aerobic exercises    Pharmacotherapy changes for the treatment of obesity: Trokendi  XR 25 mg qAM   ASSOCIATED CONDITIONS ADDRESSED TODAY  Chronic nonintractable headache, unspecified headache type Improved with a reduction in headache frequency on Trokendi  XR 25 mg once daily Tolerating well Increase water intake -     Topiramate  ER; Take 1 capsule (25 mg total) by mouth daily.  Dispense: 30 capsule; Refill: 1  Vitamin D  deficiency Last vitamin D  Lab Results  Component Value Date   VD25OH 40.2 08/27/2023  He is on OTC  vitamin D  5,000 international units  daily Vitamin D  level UTD  Class 2 severe obesity due to excess calories with serious comorbidity and body mass index (BMI) of 36.0 to 36.9 in adult (HCC)  OSA on CPAP He is wearing CPAP nightly, notes reviewed from 5/13 with Chris Brewster NP Improve sleep time to 8 hrs at night using CPAP Continue use of CPAP for mild OSA    He was  informed of the importance of frequent follow up visits to maximize his success with intensive lifestyle modifications for his multiple health conditions.   ATTESTASTION STATEMENTS:  Reviewed by clinician on day of visit: allergies, medications, problem list, medical history, surgical history, family history, social history, and previous encounter notes pertinent to obesity diagnosis.   I have personally spent 25 minutes total time today in preparation, patient care, nutritional counseling and education,  and documentation for this visit, including the following: review of most recent clinical lab tests, prescribing medications/ refilling medications, reviewing medical assistant documentation, review and interpretation of bioimpedence results.     Chris Mcconnell, D.O. DABFM, DABOM [facility] [address] [place], [place] [address] [phone]

## 2023-12-15 ENCOUNTER — Encounter: Payer: Self-pay | Admitting: Family Medicine

## 2023-12-15 ENCOUNTER — Ambulatory Visit: Admitting: Family Medicine

## 2023-12-15 VITALS — BP 135/80 | HR 86 | Temp 98.0°F | Ht 71.0 in | Wt 257.0 lb

## 2023-12-15 DIAGNOSIS — E559 Vitamin D deficiency, unspecified: Secondary | ICD-10-CM | POA: Diagnosis not present

## 2023-12-15 DIAGNOSIS — G8929 Other chronic pain: Secondary | ICD-10-CM

## 2023-12-15 DIAGNOSIS — R519 Headache, unspecified: Secondary | ICD-10-CM | POA: Diagnosis not present

## 2023-12-15 DIAGNOSIS — E88819 Insulin resistance, unspecified: Secondary | ICD-10-CM

## 2023-12-15 DIAGNOSIS — Z6835 Body mass index (BMI) 35.0-35.9, adult: Secondary | ICD-10-CM

## 2023-12-15 DIAGNOSIS — E66812 Obesity, class 2: Secondary | ICD-10-CM

## 2023-12-15 DIAGNOSIS — E6609 Other obesity due to excess calories: Secondary | ICD-10-CM

## 2023-12-15 MED ORDER — TOPIRAMATE ER 25 MG PO CAP24
1.0000 | ORAL_CAPSULE | Freq: Every day | ORAL | 1 refills | Status: DC
Start: 2023-12-15 — End: 2024-02-23

## 2023-12-15 NOTE — Progress Notes (Signed)
 Office: [phone]  /  Fax: [phone]  WEIGHT SUMMARY AND BIOMETRICS  Starting Date: 05/20/22  Starting Weight: 287lb   Weight Lost Since Last Visit: 5lb   Vitals Temp: 98 F (36.7 C) BP: 135/80 Pulse Rate: 86 SpO2: 96 %   Body Composition  Body Fat %: 34.4 % Fat Mass (lbs): 88.6 lbs Muscle Mass (lbs): 160.6 lbs Total Body Water (lbs): 114.8 lbs Visceral Fat Rating : 17    HPI  Chief Complaint: OBESITY  Chris Mcconnell is here to discuss his progress with his obesity treatment plan. He is on the the Category 4 Plan and states he is following his eating plan approximately 20 % of the time. He states he is exercising 30 minutes 3 times per week.  Interval History:  Since last office visit he is down 5 lb He is down 0.2 lb of muscle mass and down 4.2 lb of body fat since last visit This gives him a net weight loss of 30 lb in 6.5 mos of medically supervised weight management This is a 10.4% TBW loss He has indulged in more cake in the past month (enjoyed at a diner with his son) He has been eating out more often but is mindful of lean protein and salads He is mindful of protein sizes  He has been walking on the treadmill 1 miles 3 x a week He has moved from a 44 pants to a 40 pant size He is keeping junk food out of the house  Pharmacotherapy: Trokendi  XR 25 mg   PHYSICAL EXAM:  Blood pressure 135/80, pulse 86, temperature 98 F (36.7 C), height 5' 11 (1.803 m), weight 257 lb (116.6 kg), SpO2 96%. Body mass index is 35.84 kg/m.  General: He is overweight, cooperative, alert, well developed, and in no acute distress. PSYCH: Has normal mood, affect and thought process.   Lungs: Normal breathing effort, no conversational dyspnea.   ASSESSMENT AND PLAN  TREATMENT PLAN FOR OBESITY:  Recommended Dietary Goals  Chris Mcconnell is currently in the action stage of change. As such, his goal is to continue weight management plan. He has agreed to keeping a food journal and  adhering to recommended goals of 2000 calories and 130 g of protein and practicing portion control and making smarter food choices, such as increasing vegetables and decreasing simple carbohydrates.  Behavioral Intervention  We discussed the following Behavioral Modification Strategies today: increasing lean protein intake to established goals, increasing vegetables, increasing fiber rich foods, increasing water intake , work on meal planning and preparation, keeping healthy foods at home, continue to practice mindfulness when eating, better snacking choices, and continue to work on maintaining a reduced calorie state, getting the recommended amount of protein, incorporating whole foods, making healthy choices, staying well hydrated and practicing mindfulness when eating..  Additional resources provided today: NA  Recommended Physical Activity Goals  Chris Mcconnell has been advised to work up to 150 minutes of moderate intensity aerobic activity a week and strengthening exercises 2-3 times per week for cardiovascular health, weight loss maintenance and preservation of muscle mass.   He has agreed to [employer] strengthening exercises with a goal of 2-3 sessions a week  and Increase the intensity, frequency or duration of aerobic exercises   Increase walking time to 2 miles 3 days/ wk Add in weight training at the gym 2 days/ wk  Pharmacotherapy changes for the treatment of obesity: none  ASSOCIATED CONDITIONS ADDRESSED TODAY  Chronic nonintractable headache, unspecified headache type -  Topiramate  ER; Take 1 capsule (25 mg total) by mouth daily.  Dispense: 30 capsule; Refill: 1 Migraine frequency improving Tolerating Trokendi  XR 25 mg well w/o brain fog or paresthesias Had a piercing for migraines on the R and plans to have one on the L (helping)  Class 2 obesity due to excess calories with body mass index (BMI) of 35.0 to 35.9 in adult, unspecified whether serious comorbidity present He is doing a  great job with mindful eating, limiting frequency of sweets and starches and making better choices when eating out  Insulin  resistance Improving Last fasting insulin  15.8 4/24 Continue to work on weight reduction  Vitamin D  deficiency Last vitamin D  Lab Results  Component Value Date   VD25OH 40.2 08/27/2023   Continue vitamin D  2,000 international units  daily for maintenance    He was informed of the importance of frequent follow up visits to maximize his success with intensive lifestyle modifications for his multiple health conditions.   ATTESTASTION STATEMENTS:  Reviewed by clinician on day of visit: allergies, medications, problem list, medical history, surgical history, family history, social history, and previous encounter notes pertinent to obesity diagnosis.   I have personally spent 30 minutes total time today in preparation, patient care, nutritional counseling and education,  and documentation for this visit, including the following: review of most recent clinical lab tests, prescribing medications/ refilling medications, reviewing medical assistant documentation, review and interpretation of bioimpedence results.     Darice Haddock, D.O. DABFM, DABOM [facility] [address] [place], [place] [address] [phone]

## 2024-01-19 ENCOUNTER — Encounter: Payer: Self-pay | Admitting: Family Medicine

## 2024-01-19 ENCOUNTER — Ambulatory Visit: Admitting: Family Medicine

## 2024-01-19 VITALS — BP 119/69 | HR 74 | Temp 98.1°F | Ht 71.0 in | Wt 256.0 lb

## 2024-01-19 DIAGNOSIS — E559 Vitamin D deficiency, unspecified: Secondary | ICD-10-CM

## 2024-01-19 DIAGNOSIS — R519 Headache, unspecified: Secondary | ICD-10-CM | POA: Diagnosis not present

## 2024-01-19 DIAGNOSIS — E66812 Other obesity due to excess calories: Secondary | ICD-10-CM

## 2024-01-19 DIAGNOSIS — M5412 Radiculopathy, cervical region: Secondary | ICD-10-CM | POA: Diagnosis not present

## 2024-01-19 DIAGNOSIS — Z9189 Other specified personal risk factors, not elsewhere classified: Secondary | ICD-10-CM

## 2024-01-19 DIAGNOSIS — Z6835 Body mass index (BMI) 35.0-35.9, adult: Secondary | ICD-10-CM

## 2024-01-19 DIAGNOSIS — G8929 Other chronic pain: Secondary | ICD-10-CM | POA: Diagnosis not present

## 2024-01-19 DIAGNOSIS — Z7289 Other problems related to lifestyle: Secondary | ICD-10-CM

## 2024-01-19 MED ORDER — VITAMIN D3 125 MCG (5000 UT) PO CAPS
5000.0000 [IU] | ORAL_CAPSULE | Freq: Every day | ORAL | Status: DC
Start: 2024-01-19 — End: 2024-03-24

## 2024-01-19 MED ORDER — TOPIRAMATE ER 50 MG PO CAP24
1.0000 | ORAL_CAPSULE | Freq: Every day | ORAL | 1 refills | Status: DC
Start: 2024-01-19 — End: 2024-02-23

## 2024-01-19 NOTE — Progress Notes (Signed)
 Office: [phone]  /  Fax: [phone]  WEIGHT SUMMARY AND BIOMETRICS  Starting Date: 05/20/22  Starting Weight: 287lb   Weight Lost Since Last Visit: 1lb   Vitals Temp: 98.1 F (36.7 C) BP: 119/69 Pulse Rate: 74 SpO2: 97 %   Body Composition  Body Fat %: 34.4 % Fat Mass (lbs): 88.2 lbs Muscle Mass (lbs): 159.8 lbs Total Body Water (lbs): 115 lbs Visceral Fat Rating : 17    HPI  Chief Complaint: OBESITY  Chris Mcconnell is here to discuss his progress with his obesity treatment plan. He is on the the Category 4 Plan and states he is following his eating plan approximately 70 % of the time. He states he is exercising 0 minutes 0 times per week.  Interval History:  Since last office visit he is down 1 lb This gives him a net weight loss of 31 lb in 8 mos of medically supervised weight management This is a 10.8% TBW loss Since last visit, he went to the ED for cervical radiculopathy 9/10- sent home on muscle relaxers He has misplaced his Trokendi  x 2.5 weeks He has been having more frequent migraines He was having a hard time meal planning and is skipping meals then overeating His parents will be moving in with him and her mom is supportive of healthy eating He was tolerating Trokendi  25 mg well He has been more stressed out  Pharmacotherapy: Trokendi  XR 25 mg daily (off x 2.5 weeks)  PHYSICAL EXAM:  Blood pressure 119/69, pulse 74, temperature 98.1 F (36.7 C), height 5' 11 (1.803 m), weight 256 lb (116.1 kg), SpO2 97%. Body mass index is 35.7 kg/m.  General: He is overweight, cooperative, alert, well developed, and in no acute distress. PSYCH: Has normal mood, affect and thought process.   Lungs: Normal breathing effort, no conversational dyspnea.   ASSESSMENT AND PLAN  TREATMENT PLAN FOR OBESITY:  Recommended Dietary Goals  Chris Mcconnell is currently in the action stage of change. As such, his goal is to continue weight management plan. He has agreed to  practicing portion control and making smarter food choices, such as increasing vegetables and decreasing simple carbohydrates.  Behavioral Intervention  We discussed the following Behavioral Modification Strategies today: increasing lean protein intake to established goals, avoiding skipping meals, keeping healthy foods at home, work on managing stress, creating time for self-care and relaxation, avoiding temptations and identifying enticing environmental cues, continue to practice mindfulness when eating, and planning for success.  Additional resources provided today: NA  Recommended Physical Activity Goals  Chris Mcconnell has been advised to work up to 150 minutes of moderate intensity aerobic activity a week and strengthening exercises 2-3 times per week for cardiovascular health, weight loss maintenance and preservation of muscle mass.   He has agreed to Increase the intensity, frequency or duration of aerobic exercises    Pharmacotherapy changes for the treatment of obesity: increase Trokendi  XR to 50 mg daily  ASSOCIATED CONDITIONS ADDRESSED TODAY  Chronic nonintractable headache, unspecified headache type He has an increase in migraines seasonally in the fall and has had more headaches without Trokendi  x 2 weeks esp with cervical radiculopathy.  He was not have adverse SE from Trokendi  Xr 25 mg daily.  Increase dose to 50 mg daily. -     Topiramate  ER; Take 1 capsule (50 mg total) by mouth daily.  Dispense: 30 capsule; Refill: 1  Vitamin D  deficiency Doing well on vitamin D  5,000 international units  daily.  Repeat lab in  2 mos -     Vitamin D3; Take 1 capsule (5,000 Units total) by mouth daily.  Class 2 obesity due to excess calories with body mass index (BMI) of 35.0 to 35.9 in adult, unspecified whether serious comorbidity present Improving  Reviewed plan of care on AVS to continue active plan for weight reduction His motivation for behavioral changes remains high  Cervical  radiculopathy New.  Reviewed CT scan from [facility] 01/13/24 showing mild multilevel DDD and facet arthrosis.   Doing well on RX muscle relaxers.  Advised to f/u with PCP if neck pain with radiation to R elbow is not resolved after 2 weeks of treatment.  Sedentary lifestyle Improving.  He did get a stand up desk at work and plans to increase outdoor walks, yardwork and join the gym w/ his dad.  His motivation to continue to make changes remains high.   He was informed of the importance of frequent follow up visits to maximize his success with intensive lifestyle modifications for his multiple health conditions.   ATTESTASTION STATEMENTS:  Reviewed by clinician on day of visit: allergies, medications, problem list, medical history, surgical history, family history, social history, and previous encounter notes pertinent to obesity diagnosis.   I have personally spent 30 minutes total time today in preparation, patient care, nutritional counseling and education,  and documentation for this visit, including the following: review of most recent clinical lab tests, prescribing medications/ refilling medications, reviewing medical assistant documentation, review and interpretation of bioimpedence results.     Darice Haddock, D.O. DABFM, DABOM [facility] [address] [place], [place] [address] [phone]

## 2024-01-19 NOTE — Patient Instructions (Addendum)
 Increase Trokendi  XR to 50 mg daily Let me know if any problems or questions  Aim for either outdoor yardwork, outdoor walks or gym workouts for a total of 4 days/ wk  Work on meal planning/ eating all 3 meals/ more fruits and veggies Limit meals out to 2 per week  Check out [email or web address] for healthier dinner recipes

## 2024-01-20 ENCOUNTER — Ambulatory Visit: Payer: Self-pay

## 2024-01-20 NOTE — Telephone Encounter (Signed)
 FYI Only or Action Required?: FYI only for provider.  Patient was last seen in primary care on 01/19/2024 by Waylan Darice BRAVO, DO.  Called Nurse Triage reporting Hospitalization Follow-up, Shoulder Pain, Neck Pain, prior numbness, and Headache.  Symptoms began Mcconnell week ago.  Interventions attempted: OTC medications: motrin , Rest, hydration, or home remedies, and Other: ED on 9/10 ruled out stroke.  Symptoms are: gradually improving but persisting.  Triage Disposition: See HCP Within 4 Hours (Or PCP Triage) (overriding See Physician Within 24 Hours)  Patient/caregiver understands and will follow disposition?: Yes     Copied from CRM #[id number]. Topic: Clinical - Red Word Triage >> Jan 20, 2024  8:40 AM Chris Mcconnell wrote: Red Word that prompted transfer to Nurse Triage: patient went to ER last week and was told he had Mcconnell pinched nerve, states worsening pain in shoulder and neck area  Reason for Disposition  [1] MODERATE headache (e.g., interferes with normal activities) AND [2] present > 24 hours AND [3] unexplained  (Exceptions: Pain medicines not tried, typical migraine, or headache part of viral illness.)  Answer Assessment - Initial Assessment Questions Advised pt be examined in next 4 hours for symptoms, no PCP availability today, scheduled hospital follow up earliest available for tomorrow am, advised call back if any worsening or new symptoms, head to hospital right away if severe pain or numbness/tingling returns.   1. ONSET: When did the pain start?     9/10 Was just sitting there at my desk doing some work, moving mouse around at work, got in extreme pain, no falls slips or anything, Pinched nerve per ED Right arm went numb and face went numb that day so went to hospital They said no stroke 2. LOCATION: Where is the pain located?     More so right where shoulder meets the neck 3. PAIN: How bad is the pain? (Scale 1-10; or mild, moderate, severe)   Not worse, about same it was  3-4/10 in ED 6. OTHER SYMPTOMS: Do you have any other symptoms? (e.g., neck pain, swelling, rash, fever, numbness, weakness)     No numbness or tingling since ED No chest pain or SOB Been feeling weaker on the right side definitely with all the pain, not weaker but can't pick up as much because of the pain, full rang of motion just hurts to do so Headache not uncommon for me, gotten Mcconnell little bit worse since last Wednesday could also be from weather changing like usual, did get migraine last Wednesday went this happened, turned into normal headache bearable 3/10 that has been constant since then Took motrin  last night and eased the headache Mcconnell bit, took motrin  20 min ago, helped the headache part but not done anything to help pain in shoulder Confirms no changes to vision, speech, or awareness Pt alert and oriented  Protocols used: Shoulder Pain-Mcconnell-AH, Headache-Mcconnell-AH

## 2024-01-21 ENCOUNTER — Ambulatory Visit: Admitting: Internal Medicine

## 2024-01-21 VITALS — BP 122/80 | HR 87 | Temp 97.8°F | Ht 72.0 in | Wt 264.6 lb

## 2024-01-21 DIAGNOSIS — M47812 Spondylosis without myelopathy or radiculopathy, cervical region: Secondary | ICD-10-CM | POA: Insufficient documentation

## 2024-01-21 MED ORDER — MELOXICAM 7.5 MG PO TABS: 7.5000 mg | ORAL_TABLET | Freq: Two times a day (BID) | ORAL | 0 refills | Status: AC

## 2024-01-21 NOTE — Patient Instructions (Signed)
  VISIT SUMMARY: You visited us  today due to ongoing neck pain that radiates to your right arm. This pain started while you were at work and led to an ER visit where you were diagnosed with mild cervical spondylosis. Since then, the numbness in your arm and face has resolved, but the neck pain persists, especially with certain movements.  YOUR PLAN: -CERVICAL SPONDYLOSIS WITH RADICULOPATHY AND NECK PAIN: Cervical spondylosis is a condition where there is wear and tear on the bones and discs in your neck, which can cause pain and stiffness. Radiculopathy means that the nerve roots in your neck are irritated or compressed, leading to pain that can radiate to your arm. We have prescribed meloxicam  15 mg twice daily for two weeks to help reduce inflammation and pain. Continue using Norflex 100 mg as needed for muscle tension and spasms. Use a heating pad to help relieve muscle tightness. Keep doing neck stretches but avoid overextending your neck. Use your standing desk more often and focus on maintaining good posture. Avoid weightlifting and high-impact activities for the next two weeks. Walking can help improve your posture and strengthen your back muscles. If your symptoms do not improve in two weeks, we may refer you to physical therapy.  INSTRUCTIONS: Please follow up in two weeks if your symptoms do not improve. If needed, we will consider referring you to physical therapy at that time.                      Contains text generated by Abridge.                                 Contains text generated by Abridge.

## 2024-01-21 NOTE — Progress Notes (Signed)
 [facility] [address] [place] [address] Dept: [phone] Dept Fax: [phone]  Acute Care Office Visit  Subjective:   Chris Mcconnell 1980-08-01 01/21/2024  Chief Complaint  Patient presents with   Headache    Better since ER visit staring in back of neck  CT scan showed pinched nerve and arthritis    Shoulder Pain    Right shoulder worst as you use it pain is better since ER visit. Ct scan was done and clear    HPI:  Discussed the use of AI scribe software for clinical note transcription with the patient, who gave verbal consent to proceed.  History of Present Illness   Chris Mcconnell is a 43 year old male who presents with neck pain radiating to his right arm.  He was seen in the ER on January 13, 2024, after experiencing pain on the right side of his neck that radiated down his right arm while at work. He was transported to the ER by EMS. A heat pack was applied, which provided some relief. He also had a headache for the past few days. No recent injuries or lower extremity numbness were noted.  In the ER, a CT scan of the head showed no acute intracranial abnormality, while a CT scan of the neck revealed mild cervical spondylosis without acute bony abnormality. At discharge, he was prescribed Norflex 100 mg BID PRN for muscle tension and spasms.  Since the ER visit, the numbness in his arm and face has resolved, but he continues to experience neck pain, particularly when turning his neck or bending laterally. The pain is located on the right side of his neck and worsens with certain movements. Norflex has been used as needed but has not significantly alleviated the pain. He has also tried Motrin  PM, which helped him sleep but did not notably relieve the pain.  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  He has been attempting neck stretches but avoids overextending to prevent  exacerbating the pain.       The following portions of the patient's history were reviewed and updated as appropriate: past medical history, past surgical history, family history, social history, allergies, medications, and problem list.   Patient Active Problem List   Diagnosis Date Noted   Cervical spondylosis 01/21/2024   OSA on CPAP 07/29/2023   Chronic insomnia 07/29/2023   Myalgia 07/29/2023   Vitamin D  deficiency 06/04/2023   Insulin  resistance 06/04/2023   Chronic midline low back pain 05/21/2023   Chronic migraine without aura without status migrainosus, not intractable 05/21/2023   Sleep-disordered breathing 05/21/2023   Elevated hemoglobin (HCC) 05/21/2023   Class 3 severe obesity due to excess calories without serious comorbidity with body mass index (BMI) of 40.0 to 44.9 in adult 03/23/2023   Depression, major, single episode, mild (HCC) 10/07/2022   Obesity, Class II, BMI 35-39.9 03/06/2022   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   Past Medical History:  Diagnosis Date   Anxiety    Back pain    Depression    Multiple food allergies    Stroke (cerebrum) (HCC)    Past Surgical History:  Procedure Laterality Date   ADENOIDECTOMY     TONSILLECTOMY     Family History  Problem Relation Age of Onset   Cancer Mother    Stroke Mother     Current Outpatient Medications:    Cholecalciferol (VITAMIN D3) 125 MCG (5000  UT) CAPS, Take 1 capsule (5,000 Units total) by mouth daily., Disp: , Rfl:    EPINEPHrine 0.3 mg/0.3 mL IJ SOAJ injection, Inject into the muscle., Disp: , Rfl:    fluticasone (FLONASE) 50 MCG/ACT nasal spray, Place into the nose., Disp: , Rfl:    magnesium 30 MG tablet, Take 30 mg by mouth daily., Disp: , Rfl:    meloxicam  (MOBIC ) 7.5 MG tablet, Take 1 tablet (7.5 mg total) by mouth 2 (two) times daily for 14 days., Disp: 28 tablet, Rfl: 0   Misc. Devices MISC, Start AutoCPAP at 5-15 cm. water pressure.  Prefer Resmed S11 AutoCPAP machine with a mask, supplies and  heated tubing for mild OSA with AHI 13.  Mask of patient preference - please do new mask fitting.  Send to a local DME., Disp: , Rfl:    orphenadrine (NORFLEX) 100 MG tablet, Take 100 mg by mouth 2 (two) times daily., Disp: , Rfl:    Topiramate  ER (TROKENDI  XR) 25 MG CP24, Take 1 capsule (25 mg total) by mouth daily. (Patient taking differently: Take 50 mg by mouth daily.), Disp: 30 capsule, Rfl: 1   Topiramate  ER (TROKENDI  XR) 50 MG CP24, Take 1 capsule (50 mg total) by mouth daily., Disp: 30 capsule, Rfl: 1   LORATADINE CHILDRENS 5 MG/5ML syrup, Take by mouth. (Patient not taking: Reported on 01/21/2024), Disp: , Rfl:  Allergies  Allergen Reactions   Latex Other (See Comments)    Stomach pain     ROS: A complete ROS was performed with pertinent positives/negatives noted in the HPI. The remainder of the ROS are negative.    Objective:   Today's Vitals   01/21/24 0934  BP: 122/80  Pulse: 87  Temp: 97.8 F (36.6 C)  TempSrc: Temporal  SpO2: 98%  Weight: 264 lb 9.6 oz (120 kg)  Height: 6' (1.829 m)    GENERAL: Well-appearing, in NAD. Well nourished.  SKIN: Pink, warm and dry.  NECK: Trachea midline.  Pain reproducible with neck flexion and lateral bending, specifically lateral bending to the right side.  Tenderness to palpation to cervical spine diffusely and to right upper back. RESPIRATORY: Chest wall symmetrical. Respirations even and non-labored.  PSYCH/MENTAL STATUS: Alert, oriented x 3. Cooperative, appropriate mood and affect.    No results found for any visits on 01/21/24.    Assessment & Plan:  Assessment and Plan    Cervical spondylosis Chronic neck pain with radiculopathy, primarily on the right side. CT neck showed mild cervical spondylosis. Symptoms include persistent neck pain and muscle tightness, likely exacerbated by poor posture and prolonged sitting. - Radiculopathy resolved. - Prescribed meloxicam  7.5mg  twice daily for two weeks. - Continue Norflex 100 mg  BID PRN. - Encouraged use of heating pad. - Advised continuation of neck stretches, avoiding overextension. - Recommended increased use of standing desk and improved posture. - Advised against weightlifting and high-impact activities for two weeks. - Encouraged walking to improve posture and strengthen back muscles. - Consider referral to physical therapy if no improvement in two weeks.       Meds ordered this encounter  Medications   meloxicam  (MOBIC ) 7.5 MG tablet    Sig: Take 1 tablet (7.5 mg total) by mouth 2 (two) times daily for 14 days.    Dispense:  28 tablet    Refill:  0    Supervising Provider:   SEBASTIAN BEVERLEY NOVAK [[id number]]   No orders of the defined types were placed in this encounter.  Lab  Orders  No laboratory test(s) ordered today   No images are attached to the encounter or orders placed in the encounter.  Return if symptoms worsen or fail to improve.   Rosina Senters, FNP

## 2024-02-23 ENCOUNTER — Ambulatory Visit (INDEPENDENT_AMBULATORY_CARE_PROVIDER_SITE_OTHER): Admitting: Family Medicine

## 2024-02-23 ENCOUNTER — Encounter: Payer: Self-pay | Admitting: Family Medicine

## 2024-02-23 VITALS — BP 111/72 | HR 74 | Temp 97.7°F | Ht 71.0 in | Wt 256.0 lb

## 2024-02-23 DIAGNOSIS — R519 Headache, unspecified: Secondary | ICD-10-CM

## 2024-02-23 DIAGNOSIS — Z7289 Other problems related to lifestyle: Secondary | ICD-10-CM | POA: Diagnosis not present

## 2024-02-23 DIAGNOSIS — G4733 Obstructive sleep apnea (adult) (pediatric): Secondary | ICD-10-CM

## 2024-02-23 DIAGNOSIS — Z6836 Body mass index (BMI) 36.0-36.9, adult: Secondary | ICD-10-CM

## 2024-02-23 DIAGNOSIS — Z9189 Other specified personal risk factors, not elsewhere classified: Secondary | ICD-10-CM

## 2024-02-23 DIAGNOSIS — E559 Vitamin D deficiency, unspecified: Secondary | ICD-10-CM | POA: Diagnosis not present

## 2024-02-23 DIAGNOSIS — E66812 Obesity, class 2: Secondary | ICD-10-CM

## 2024-02-23 DIAGNOSIS — G8929 Other chronic pain: Secondary | ICD-10-CM | POA: Diagnosis not present

## 2024-02-23 DIAGNOSIS — E88819 Insulin resistance, unspecified: Secondary | ICD-10-CM

## 2024-02-23 DIAGNOSIS — Z6835 Body mass index (BMI) 35.0-35.9, adult: Secondary | ICD-10-CM

## 2024-02-23 MED ORDER — TOPIRAMATE ER 50 MG PO CAP24: 1.0000 | ORAL_CAPSULE | Freq: Every day | ORAL | 0 refills | Status: DC

## 2024-02-23 NOTE — Patient Instructions (Signed)
 Work on: [employer] intake on the [employer] Aim for 2000 calories per day This should include 110 g of protein daily or more  Work on wearing CPAP once head congestion clears up  Plan to: Get to the gym 2 days/ wk  Avoid high sugar foods and drinks  Fasting labs next visit

## 2024-02-23 NOTE — Progress Notes (Signed)
 Office: [phone]  /  Fax: [phone]  WEIGHT SUMMARY AND BIOMETRICS  Starting Date: 05/20/22  Starting Weight: 287lb   Weight Lost Since Last Visit: 0   Vitals Temp: 97.7 F (36.5 C) BP: 111/72 Pulse Rate: 74 SpO2: 98 %   Body Composition  Body Fat %: 34.1 % Fat Mass (lbs): 87.6 lbs Muscle Mass (lbs): 160.8 lbs Total Body Water (lbs): 114.6 lbs Visceral Fat Rating : 17    HPI  Chief Complaint: OBESITY  Chris Mcconnell is here to discuss his progress with his obesity treatment plan. He is on the the Category 4 Plan and states he is following his eating plan approximately 50 % of the time. He states he is walking 2 miles a day and 2-3 times per week.  Interval History:  Since last office visit he is down 0 lb He is up 1 lb of muscle mass and is down 0.6 lb of body fat since last visit This gives him a net weight loss of 31 pounds in the past 9 months of medically supervised weight management This is a 10.8% total body weight loss He is surprised that he is not down more weight He has been moving his parents into his house He has been a little less active with some walking His dad will be joining the gym and he plans to start going with him 2 days/ wk He has not been wearing his CPAP at night lately He has had more head congestion the past few days His 24-hour food recall does include regular lemonade and a hot dog He has seen a reduction in headache frequency with Trokendi  50 mg daily without adverse side effect and some improvements in food impulse control  Pharmacotherapy: Trokendi  50 mg XR once daily  PHYSICAL EXAM:  Blood pressure 111/72, pulse 74, temperature 97.7 F (36.5 C), height 5' 11 (1.803 m), weight 256 lb (116.1 kg), SpO2 98%. Body mass index is 35.7 kg/m.  General: He is overweight, cooperative, alert, well developed, and in no acute distress. PSYCH: Has normal mood, affect and thought process.   Lungs: Normal breathing effort, no conversational  dyspnea.   ASSESSMENT AND PLAN  TREATMENT PLAN FOR OBESITY:  Recommended Dietary Goals  Chris Mcconnell is currently in the action stage of change. As such, his goal is to continue weight management plan. He has agreed to the Category 4 Plan. Begin tracking calorie intake using the my net diary app and aiming for 2000 cal/day which should include 110 or more grams of protein daily Behavioral Intervention  We discussed the following Behavioral Modification Strategies today: increasing lean protein intake to established goals, increasing vegetables, increasing fiber rich foods, increasing water intake , work on meal planning and preparation, work on tracking and journaling calories using tracking application, reading food labels , keeping healthy foods at home, identifying sources and decreasing liquid calories, avoiding temptations and identifying enticing environmental cues, and planning for success.  Additional resources provided today: NA  Recommended Physical Activity Goals  [name] has been advised to work up to 150 minutes of moderate intensity aerobic activity a week and strengthening exercises 2-3 times per week for cardiovascular health, weight loss maintenance and preservation of muscle mass.   He has agreed to [employer] strengthening exercises with a goal of 2-3 sessions a week  and Increase the intensity, frequency or duration of aerobic exercises   Reviewed exercise change goals on after visit summary  Pharmacotherapy changes for the treatment of obesity: None  ASSOCIATED  CONDITIONS ADDRESSED TODAY  Chronic nonintractable headache, unspecified headache type Improving.  He has seen a reduction in headache frequency with use of Trokendi , tolerating well without adverse side effect.  Continue Trokendi  at 50 mg once daily.  Update chemistry panel next visit. -     Topiramate  ER; Take 1 capsule (50 mg total) by mouth daily.  Dispense: 30 capsule; Refill: 0  Class 2 severe obesity due to  excess calories with serious comorbidity and body mass index (BMI) of 36.0 to 36.9 in adult Overall improving but has seen a plateau in weight loss over the past month.  Room for improvement with more consistent exercise and has been off his meal plan more frequently.  Asked him to start tracking his calorie intake and reviewed an exercise goal on his after visit summary.  Vitamin D  deficiency Last vitamin D  Lab Results  Component Value Date   VD25OH 40.2 08/27/2023  Vitamin D  has improved.  He is to continue vitamin D  2000 IU once daily for maintenance  Sedentary lifestyle His job is quite sedentary and this has factored into his weight.  He reports maintaining a much lower body weight while working an active job in years past.  We discussed the importance of adding in more regular walking and weight training at the gym on a consistent basis.  Insulin  resistance Improving.  His last fasting insulin  was elevated at 15.8, previously at 18.7.  He also has associated metabolic syndrome.  He is at risk for insulin  resistance with central adiposity, a sedentary job and a poor diet.  Continue to work on weight reduction, reducing added sugar and refined carbohydrates.  We discussed importance of regular walking and weight training.  Plan to recheck fasting insulin  with next labs  OSA on CPAP He has had worsening compliance wearing his CPAP at night.  We discussed importance of wearing CPAP and aiming for 8 hours of high-quality sleep at night and metabolic health and weight reduction.     He was informed of the importance of frequent follow up visits to maximize his success with intensive lifestyle modifications for his multiple health conditions.   ATTESTASTION STATEMENTS:  Reviewed by clinician on day of visit: allergies, medications, problem list, medical history, surgical history, family history, social history, and previous encounter notes pertinent to obesity diagnosis.   I have  personally spent 31 minutes total time today in preparation, patient care, nutritional counseling and education,  and documentation for this visit, including the following: review of most recent clinical lab tests, prescribing medications/ refilling medications, reviewing medical assistant documentation, review and interpretation of bioimpedence results.     Darice Haddock, D.O. DABFM, DABOM [facility] [address] [place], [place] [address] [phone]

## 2024-03-23 ENCOUNTER — Ambulatory Visit (INDEPENDENT_AMBULATORY_CARE_PROVIDER_SITE_OTHER): Admitting: Internal Medicine

## 2024-03-23 VITALS — BP 124/80 | HR 94 | Temp 98.4°F | Ht 72.0 in | Wt 259.4 lb

## 2024-03-23 DIAGNOSIS — Z0000 Encounter for general adult medical examination without abnormal findings: Secondary | ICD-10-CM | POA: Diagnosis not present

## 2024-03-23 NOTE — Progress Notes (Signed)
 Subjective:   Sho Salguero 1980/08/18 03/23/2024  CC: Chief Complaint  Patient presents with   Establish Care    Not fasting, bump under left eye     HPI: Elmin Wiederholt is a 43 y.o. male who presents for a routine health maintenance exam.  Patient is getting [facility] at [facility]   HEALTH SCREENINGS: - PSA (50+): Not applicable  No results found for: PSA1, PSA   - Colonoscopy (45+): Not applicable  - AAA Screening: Not applicable  Men age 49-75 who have ever smoked - Lung Cancer screening with low-dose CT: Not applicable-  Adults age 18-80 who are current cigarette smokers or quit within the last 15 years. Must have 20 pack year history.   IMMUNIZATIONS:  - Tdap: Tetanus vaccination status reviewed: last tetanus booster within 10 years. - Influenza: Refused   Past medical history, surgical history, medications, allergies, family history and social history reviewed with patient today and changes made to appropriate areas of the chart.   Social History   Socioeconomic History   Marital status: Single    Spouse name: Not on file   Number of children: Not on file   Years of education: Not on file   Highest education level: Associate degree: occupational, scientist, product/process development, or vocational program  Occupational History   Not on file  Tobacco Use   Smoking status: Former    Types: Cigars   Smokeless tobacco: Not on file  Substance and Sexual Activity   Alcohol use: Yes    Comment: 1 whiskey per month   Drug use: No   Sexual activity: Not Currently  Other Topics Concern   Not on file  Social History Narrative   Not on file   Social Drivers of Health   Financial Resource Strain: Low Risk  (03/23/2024)   Overall Financial Resource Strain (CARDIA)    Difficulty of Paying Living Expenses: Not hard at all  Food Insecurity: No Food Insecurity (03/23/2024)   Hunger Vital Sign    Worried About Running Out of Food in the Last Year: Never true     Ran Out of Food in the Last Year: Never true  Transportation Needs: No Transportation Needs (03/23/2024)   PRAPARE - Administrator, Civil Service (Medical): No    Lack of Transportation (Non-Medical): No  Physical Activity: Insufficiently Active (03/23/2024)   Exercise Vital Sign    Days of Exercise per Week: 3 days    Minutes of Exercise per Session: 30 min  Stress: No Stress Concern Present (03/23/2024)   [employer] of Occupational Health - Occupational Stress Questionnaire    Feeling of Stress: Not at all  Social Connections: Moderately Integrated (03/23/2024)   Social Connection and Isolation Panel    Frequency of Communication with Friends and Family: More than three times a week    Frequency of Social Gatherings with Friends and Family: Never    Attends Religious Services: More than 4 times per year    Active Member of [employer] or Organizations: Yes    Attends Banker Meetings: More than 4 times per year    Marital Status: Divorced  Intimate Partner Violence: Not At Risk (01/13/2024)   Received from [facility]   HITS    Over the last 12 months how often did your partner physically hurt you?: Never    Over the last 12 months how often did your partner insult you or talk down to you?: Never  Over the last 12 months how often did your partner threaten you with physical harm?: Never    Over the last 12 months how often did your partner scream or curse at you?: Never    Past Medical History:  Diagnosis Date   Allergy    Anxiety    Back pain    Depression    Multiple food allergies    Stroke (cerebrum) (HCC)     Past Surgical History:  Procedure Laterality Date   ADENOIDECTOMY     TONSILLECTOMY      Current Outpatient Medications on File Prior to Visit  Medication Sig   Cholecalciferol (VITAMIN D3) 125 MCG (5000 UT) CAPS Take 1 capsule (5,000 Units total) by mouth daily.   EPINEPHrine 0.3 mg/0.3 mL IJ SOAJ injection Inject into the  muscle.   fluticasone (FLONASE) 50 MCG/ACT nasal spray Place into the nose.   magnesium 30 MG tablet Take 30 mg by mouth daily.   Misc. Devices MISC Start AutoCPAP at 5-15 cm. water pressure.  Prefer Resmed S11 AutoCPAP machine with a mask, supplies and heated tubing for mild OSA with AHI 13.  Mask of patient preference - please do new mask fitting.  Send to a local DME.   Topiramate  ER (TROKENDI  XR) 50 MG CP24 Take 1 capsule (50 mg total) by mouth daily.   LORATADINE CHILDRENS 5 MG/5ML syrup Take by mouth. (Patient not taking: Reported on 03/23/2024)   No current facility-administered medications on file prior to visit.    Allergies  Allergen Reactions   Latex Other (See Comments)    Stomach pain    Family History  Problem Relation Age of Onset   Cancer Mother    Stroke Mother    Alcohol abuse Maternal Uncle    Drug abuse Maternal Aunt      ROS: Denies fever, fatigue, unexplained weight loss/gain, hearing or vision changes, cardiac or respiratory complaints. Denies neurological deficits, musculoskeletal complaints, gastrointestinal or genitourinary complaints, mental health complaints, and skin changes.   Objective:   Today's Vitals   03/23/24 1424  BP: 124/80  Pulse: 94  Temp: 98.4 F (36.9 C)  TempSrc: Temporal  SpO2: 98%  Weight: 259 lb 6.4 oz (117.7 kg)  Height: 6' (1.829 m)    GENERAL APPEARANCE: Well-appearing, in NAD. Well nourished.  SKIN: Pink, warm and dry. Turgor normal. No rash, lesion, ulceration, or ecchymoses. Hair evenly distributed. Clogged pores below left eye HEENT: HEAD: Normocephalic.  EYES: PERRLA. EOMI. Lids intact w/o defect. Sclera white, Conjunctiva pink w/o exudate.  EARS: External ear w/o redness, swelling, masses or lesions. EAC clear. TM's intact, translucent w/o bulging, appropriate landmarks visualized. Appropriate acuity to conversational tones.  NOSE: Septum midline w/o deformity. Nares patent, mucosa pink and non-inflamed w/o  drainage.  THROAT: Uvula midline. Oropharynx clear. Tonsils absent. Oral mucosa pink and moist.  NECK: Supple, Trachea midline. Full ROM w/o pain or tenderness. No lymphadenopathy. Thyroid non-tender w/o enlargement or palpable masses.  RESPIRATORY: Chest wall symmetrical w/o masses. Respirations even and non-labored. Breath sounds clear to auscultation bilaterally. No wheezes, rales, rhonchi, or crackles. CARDIAC: S1, S2 present, regular rate and rhythm. No gallops, murmurs, rubs, or clicks. Capillary refill <2 seconds. Peripheral pulses 2+ bilaterally. GI: Abdomen soft w/o distention. Normoactive bowel sounds. No palpable masses or tenderness. No guarding or rebound tenderness. Liver and spleen w/o tenderness or enlargement. No CVA tenderness.  GU:  deferred exam. MSK: Muscle tone and strength appropriate for age, w/o atrophy or abnormal movement. EXTREMITIES:  Active ROM intact, w/o tenderness, crepitus, or contracture. No obvious joint deformities or effusions. No clubbing, edema, or cyanosis.  NEUROLOGIC: CN's II-XII intact. Motor strength symmetrical with no obvious weakness. No sensory deficits.  Steady, even gait.  PSYCH/MENTAL STATUS: Alert, oriented x 3. Cooperative, appropriate mood and affect.    Depression and Anxiety Screen done today and results listed below:     03/23/2024    2:48 PM 01/21/2024   10:36 AM 09/21/2023    8:16 AM 03/23/2023    8:55 AM 11/18/2022    8:02 AM  Depression screen PHQ 2/9  Decreased Interest 0 0 1 2 0  Down, Depressed, Hopeless 0 0 0 1 0  PHQ - 2 Score 0 0 1 3 0  Altered sleeping 2 1 2 1    Tired, decreased energy 1 1 2 1    Change in appetite 0 0 0 2   Feeling bad or failure about yourself  1 0 0 1   Trouble concentrating 0 1 1 0   Moving slowly or fidgety/restless 0 0 0 0   Suicidal thoughts 0 0 0 0   PHQ-9 Score 4 3  6  8     Difficult doing work/chores Not difficult at all Not difficult at all Not difficult at all Somewhat difficult      Data  saved with a previous flowsheet row definition      03/23/2024    2:48 PM 01/21/2024   10:36 AM 03/23/2023    8:56 AM 10/07/2022    8:34 AM  GAD 7 : Generalized Anxiety Score  Nervous, Anxious, on Edge 1 0 1 1  Control/stop worrying 1 0 1 1  Worry too much - different things 1 0 1 1  Trouble relaxing 0 1 1 1   Restless 0 0 0 0  Easily annoyed or irritable 0 1 1 0  Afraid - awful might happen 0 0 1 1  Total GAD 7 Score 3 2 6 5   Anxiety Difficulty Not difficult at all Not difficult at all Somewhat difficult Somewhat difficult    Assessment & Plan:  Encounter for general adult medical examination without abnormal findings  - unremarkable. Follow up 1 year.    No orders of the defined types were placed in this encounter.   PATIENT COUNSELING: - Encourage to adjust caloric intake to maintain or achieve ideal body weight, to reduce intake of dietary saturated fat and total fat, to limit sodium intake by avoiding high sodium foods and not adding table salt, and to maintain adequate dietary potassium and calcium preferably from fresh fruits, vegetables, and low-fat dairy products.   - Stress the importance of regular exercise -  NEXT PREVENTATIVE PHYSICAL DUE IN 1 YEAR.  Return in about 1 year (around 03/23/2025) for Annual Physical Exam with fasting lab work.  Rosina Senters, FNP

## 2024-03-24 ENCOUNTER — Ambulatory Visit (INDEPENDENT_AMBULATORY_CARE_PROVIDER_SITE_OTHER): Admitting: Family Medicine

## 2024-03-24 ENCOUNTER — Encounter: Payer: Self-pay | Admitting: Family Medicine

## 2024-03-24 VITALS — BP 129/80 | HR 80 | Temp 98.0°F | Ht 71.0 in | Wt 251.0 lb

## 2024-03-24 DIAGNOSIS — E66812 Obesity, class 2: Secondary | ICD-10-CM

## 2024-03-24 DIAGNOSIS — Z6835 Body mass index (BMI) 35.0-35.9, adult: Secondary | ICD-10-CM

## 2024-03-24 DIAGNOSIS — R519 Headache, unspecified: Secondary | ICD-10-CM | POA: Diagnosis not present

## 2024-03-24 DIAGNOSIS — E88819 Insulin resistance, unspecified: Secondary | ICD-10-CM | POA: Diagnosis not present

## 2024-03-24 DIAGNOSIS — G4733 Obstructive sleep apnea (adult) (pediatric): Secondary | ICD-10-CM

## 2024-03-24 DIAGNOSIS — E559 Vitamin D deficiency, unspecified: Secondary | ICD-10-CM | POA: Diagnosis not present

## 2024-03-24 DIAGNOSIS — E785 Hyperlipidemia, unspecified: Secondary | ICD-10-CM

## 2024-03-24 DIAGNOSIS — R5383 Other fatigue: Secondary | ICD-10-CM

## 2024-03-24 MED ORDER — TOPIRAMATE ER 50 MG PO CAP24: 1.0000 | ORAL_CAPSULE | Freq: Every day | ORAL | 1 refills | Status: DC

## 2024-03-24 MED ORDER — VITAMIN D3 125 MCG (5000 UT) PO CAPS: 5000.0000 [IU] | ORAL_CAPSULE | Freq: Every day | ORAL | 1 refills | Status: DC

## 2024-03-24 MED ORDER — TOPIRAMATE ER 50 MG PO CAP24: 1.0000 | ORAL_CAPSULE | Freq: Every day | ORAL | 0 refills | Status: DC

## 2024-03-24 NOTE — Progress Notes (Signed)
 Office: [phone]  /  Fax: [phone]  WEIGHT SUMMARY AND BIOMETRICS  Starting Date: 05/20/22  Starting Weight: 287lb   Weight Lost Since Last Visit: 5lb   Vitals Temp: 98 F (36.7 C) BP: 129/80 Pulse Rate: 80 SpO2: 98 %   Body Composition  Body Fat %: 33.5 % Fat Mass (lbs): 84.2 lbs Muscle Mass (lbs): 159 lbs Total Body Water (lbs): 112 lbs Visceral Fat Rating : 17   HPI  Chief Complaint: OBESITY  Gideon is here to discuss his progress with his obesity treatment plan. He is on the the Category 4 Plan and states he is following his eating plan approximately 90 % of the time. He states he is exercising 30-60 minutes 2-5 times per week.  Interval History:  Since last office visit he is down 5 lb He is down 1.8 pounds of muscle mass and down 3.4 pounds of body fat since last visit He added in gym workouts with his dad 2 days/ wk doing bike and resistance training This gives him a net weight loss of 36 lb in the past 10 mos of medically supervised weight management This is a 12.5% total body weight loss He is doing better with healthier lunches He is mindful of food choices Denies sugar cravings Occasionally has periods of hunger He is celebrating his birthday today  Pharmacotherapy: Trokendi  XR 50 mg daily  PHYSICAL EXAM:  Blood pressure 129/80, pulse 80, temperature 98 F (36.7 C), height 5' 11 (1.803 m), weight 251 lb (113.9 kg), SpO2 98%. Body mass index is 35.01 kg/m.  General: He is overweight, cooperative, alert, well developed, and in no acute distress. PSYCH: Has normal mood, affect and thought process.   Lungs: Normal breathing effort, no conversational dyspnea.  ASSESSMENT AND PLAN  TREATMENT PLAN FOR OBESITY:  Recommended Dietary Goals  Surafel is currently in the action stage of change. As such, his goal is to continue weight management plan. He has agreed to keeping a food journal and adhering to recommended goals of 2000 calories and  100 g protein and practicing portion control and making smarter food choices, such as increasing vegetables and decreasing simple carbohydrates.  Behavioral Intervention  We discussed the following Behavioral Modification Strategies today: increasing lean protein intake to established goals, increasing fiber rich foods, increasing water intake , work on tracking and journaling calories using tracking application, keeping healthy foods at home, identifying sources and decreasing liquid calories, avoiding temptations and identifying enticing environmental cues, continue to practice mindfulness when eating, and planning for success.  Additional resources provided today: NA  Recommended Physical Activity Goals  Andrez has been advised to work up to 150 minutes of moderate intensity aerobic activity a week and strengthening exercises 2-3 times per week for cardiovascular health, weight loss maintenance and preservation of muscle mass.   He has agreed to Increase the intensity, frequency or duration of strengthening exercises  and Increase the intensity, frequency or duration of aerobic exercises    Pharmacotherapy changes for the treatment of obesity: none  ASSOCIATED CONDITIONS ADDRESSED TODAY  OSA on CPAP He plans to start wearing his CPAP nightly.  He admits to nonadherence.  Look for improvements in energy level with improved compliance.  We discussed the importance of adequate sleep in weight management. Continue active plan for weight reduction Aim for 8 hours of high-quality sleep at night  Chronic nonintractable headache, unspecified headache type Improving.  He has had 0-1 headaches per month with Trokendi  XR 50 milligram daily  without adverse side effect.  Continue current dose of Trokendi .  Vitamin D  deficiency Last vitamin D  Lab Results  Component Value Date   VD25OH 40.2 08/27/2023  He is taking vitamin D  5000 IU once daily. Continue  Class 2 obesity due to excess calories  with body mass index (BMI) of 35.0 to 35.9 in adult, unspecified whether serious comorbidity present Overall improving with the addition of more regular exercise.  Encouraged increasing exercise frequency to 3 days a week to include resistance training at least 2 days a week.  Insulin  resistance Improving.  Fasting insulin  did improve from 18.7-15.8, last checked in April.  Repeat today.  Continue to work on reducing added sugar and refined carbohydrate intake, increasing both walking and resistance training.     He was informed of the importance of frequent follow up visits to maximize his success with intensive lifestyle modifications for his multiple health conditions.   ATTESTASTION STATEMENTS:  Reviewed by clinician on day of visit: allergies, medications, problem list, medical history, surgical history, family history, social history, and previous encounter notes pertinent to obesity diagnosis.   I have personally spent 30 minutes total time today in preparation, patient care, nutritional counseling and education,  and documentation for this visit, including the following: review of most recent clinical lab tests, prescribing medications/ refilling medications, reviewing medical assistant documentation, review and interpretation of bioimpedence results.     Darice Haddock, D.O. DABFM, DABOM [facility] [address] [place], [place] [address] [phone]

## 2024-03-25 LAB — LIPID PANEL
Chol/HDL Ratio: 4.6 ratio (ref 0.0–5.0)
Cholesterol, Total: 165 mg/dL (ref 100–199)
HDL: 36 mg/dL — ABNORMAL LOW (ref >39)
LDL Chol Calc (NIH): 105 mg/dL — ABNORMAL HIGH (ref 0–99)
Triglycerides: 136 mg/dL (ref 0–149)
VLDL Cholesterol Cal: 24 mg/dL (ref 5–40)

## 2024-03-25 LAB — COMPREHENSIVE METABOLIC PANEL WITH GFR
ALT: 25 IU/L (ref 0–44)
AST: 17 IU/L (ref 0–40)
Albumin: 4.6 g/dL (ref 4.1–5.1)
Alkaline Phosphatase: 81 IU/L (ref 47–123)
BUN/Creatinine Ratio: 13 (ref 9–20)
BUN: 15 mg/dL (ref 6–24)
Bilirubin Total: 1 mg/dL (ref 0.0–1.2)
CO2: 24 mmol/L (ref 20–29)
Calcium: 9.8 mg/dL (ref 8.7–10.2)
Chloride: 108 mmol/L — ABNORMAL HIGH (ref 96–106)
Creatinine, Ser: 1.14 mg/dL (ref 0.76–1.27)
Globulin, Total: 1.7 g/dL (ref 1.5–4.5)
Glucose: 85 mg/dL (ref 70–99)
Potassium: 4.4 mmol/L (ref 3.5–5.2)
Sodium: 144 mmol/L (ref 134–144)
Total Protein: 6.3 g/dL (ref 6.0–8.5)
eGFR: 82 mL/min/1.73 (ref >59)

## 2024-03-25 LAB — CBC
Hematocrit: 58.1 % — ABNORMAL HIGH (ref 37.5–51.0)
Hemoglobin: 18.8 g/dL — ABNORMAL HIGH (ref 13.0–17.7)
MCH: 29.4 pg (ref 26.6–33.0)
MCHC: 32.4 g/dL (ref 31.5–35.7)
MCV: 91 fL (ref 79–97)
Platelets: 180 x10E3/uL (ref 150–450)
RBC: 6.4 x10E6/uL — ABNORMAL HIGH (ref 4.14–5.80)
RDW: 13 % (ref 11.6–15.4)
WBC: 6.9 x10E3/uL (ref 3.4–10.8)

## 2024-03-25 LAB — VITAMIN D 25 HYDROXY (VIT D DEFICIENCY, FRACTURES): Vit D, 25-Hydroxy: 31.6 ng/mL (ref 30.0–100.0)

## 2024-03-25 LAB — HEMOGLOBIN A1C
Est. average glucose Bld gHb Est-mCnc: 94 mg/dL
Hgb A1c MFr Bld: 4.9 % (ref 4.8–5.6)

## 2024-03-25 LAB — INSULIN, RANDOM: INSULIN: 14.5 u[IU]/mL (ref 2.6–24.9)

## 2024-03-28 ENCOUNTER — Ambulatory Visit: Payer: Self-pay | Admitting: Family Medicine

## 2024-04-29 ENCOUNTER — Other Ambulatory Visit: Payer: Self-pay | Admitting: Family Medicine

## 2024-04-29 DIAGNOSIS — E559 Vitamin D deficiency, unspecified: Secondary | ICD-10-CM

## 2024-05-10 ENCOUNTER — Ambulatory Visit: Admitting: Family Medicine

## 2024-05-10 ENCOUNTER — Encounter: Payer: Self-pay | Admitting: Family Medicine

## 2024-05-10 VITALS — BP 108/71 | HR 74 | Temp 97.5°F | Ht 71.0 in | Wt 259.0 lb

## 2024-05-10 DIAGNOSIS — G8929 Other chronic pain: Secondary | ICD-10-CM

## 2024-05-10 DIAGNOSIS — D582 Other hemoglobinopathies: Secondary | ICD-10-CM

## 2024-05-10 DIAGNOSIS — Z6836 Body mass index (BMI) 36.0-36.9, adult: Secondary | ICD-10-CM | POA: Diagnosis not present

## 2024-05-10 DIAGNOSIS — E559 Vitamin D deficiency, unspecified: Secondary | ICD-10-CM | POA: Diagnosis not present

## 2024-05-10 DIAGNOSIS — R519 Headache, unspecified: Secondary | ICD-10-CM

## 2024-05-10 DIAGNOSIS — E88819 Insulin resistance, unspecified: Secondary | ICD-10-CM | POA: Diagnosis not present

## 2024-05-10 MED ORDER — VITAMIN D (ERGOCALCIFEROL) 1.25 MG (50000 UNIT) PO CAPS: 50000.0000 [IU] | ORAL_CAPSULE | ORAL | 0 refills | Status: AC

## 2024-05-10 MED ORDER — TOPIRAMATE ER 50 MG PO CAP24: 1.0000 | ORAL_CAPSULE | Freq: Every day | ORAL | 0 refills | Status: AC

## 2024-05-10 NOTE — Progress Notes (Signed)
 "  Office: [phone]  /  Fax: [phone]  WEIGHT SUMMARY AND BIOMETRICS  Starting Date: 05/20/22  Starting Weight: 287lb   Weight Lost Since Last Visit: 0lb   Vitals Temp: (!) 97.5 F (36.4 C) BP: 108/71 Pulse Rate: 74 SpO2: 98 %   Body Composition  Body Fat %: 34.6 % Fat Mass (lbs): 89.6 lbs Muscle Mass (lbs): 161.2 lbs Total Body Water (lbs): 113.8 lbs Visceral Fat Rating : 18     HPI  Chief Complaint: OBESITY  Chris Mcconnell is here to discuss his progress with his obesity treatment plan. He is on the the Category 4 Plan and states he is following his eating plan approximately 50 % of the time. He states he is exercising 30-60 minutes 1-2 times per week.   Interval History:  Since last office visit he is up 8 lb He is up 2.2 lb of muscle mass He celebrated the holidays and broke up with his girlfriend He was comfort eating sweets x 2 weeks but resumed his meal plan 1/2 He plans to workout more often His goal is 220 lb He has a net weight loss of 28 lb in 11 mos of medically supervised weight management This is a 9.7% TBW loss  Pharmacotherapy: Trokendi  XR 50 mg daily  PHYSICAL EXAM:  Blood pressure 108/71, pulse 74, temperature (!) 97.5 F (36.4 C), height 5' 11 (1.803 m), weight 259 lb (117.5 kg), SpO2 98%. Body mass index is 36.12 kg/m.  General: He is overweight, cooperative, alert, well developed, and in no acute distress. PSYCH: Has normal mood, affect and thought process.   Lungs: Normal breathing effort, no conversational dyspnea.   ASSESSMENT AND PLAN  TREATMENT PLAN FOR OBESITY:  Recommended Dietary Goals  Chris Mcconnell is currently in the action stage of change. As such, his goal is to continue weight management plan. He has agreed to keeping a food journal and adhering to recommended goals of 2000 calories and 110 g of  protein.  Behavioral Intervention  We discussed the following Behavioral Modification Strategies today: increasing lean  protein intake to established goals, increasing fiber rich foods, increasing water intake , work on meal planning and preparation, work on counselling psychologist calories using tracking application, keeping healthy foods at home, work on managing stress, creating time for self-care and relaxation, avoiding temptations and identifying enticing environmental cues, continue to practice mindfulness when eating, and getting back on track after recent lapse.  Additional resources provided today: NA  Recommended Physical Activity Goals  Chris Mcconnell has been advised to work up to 150 minutes of moderate intensity aerobic activity a week and strengthening exercises 2-3 times per week for cardiovascular health, weight loss maintenance and preservation of muscle mass.   He has agreed to [employer] strengthening exercises with a goal of 2-3 sessions a week  and Increase the intensity, frequency or duration of aerobic exercises    Pharmacotherapy changes for the treatment of obesity: none  ASSOCIATED CONDITIONS ADDRESSED TODAY  Elevated hemoglobin Reviewed labs with patient from 03/24/2024.Hemoglobin elevated 18.8, previous reading 17.2, 17.9, 17.7.  Most recent hematocrit elevated at 58.1 with previous readings of 49.4, 54.2 and 51.8.  He denies a history of hemochromatosis or blood clots.  He denies smoking or dehydration.  Referral to hematology made for further evaluation and treatment. -     Ambulatory referral to Hematology / Oncology  Vitamin D  deficiency Last vitamin D  Lab Results  Component Value Date   VD25OH 31.6 03/24/2024  Worsening.  Reviewed  labs with patient from last visit.  Vitamin D  level reduced from 40.2-31.6.  He has not been taking over-the-counter vitamin D  every day but was prescribed 5000 IU once daily.  He agrees to switching to once weekly vitamin D  50,000 IU.  Repeat lab in 4 months  -     Vitamin D  (Ergocalciferol ); Take 1 capsule (50,000 Units total) by mouth every 7 (seven) days.   Dispense: 10 capsule; Refill: 0  Chronic nonintractable headache, unspecified headache type Improving on Trokendi  XR 50 mg once daily without adverse side effect -     Topiramate  ER; Take 1 capsule (50 mg total) by mouth daily.  Dispense: 90 capsule; Refill: 0  Insulin  resistance Improving.  Reviewed labs with patient from last visit.  Fasting insulin  improved to 14.5 from 15.8.  Continue to work on diet, exercise and weight loss.  Increase walking time to at least 30 minutes daily, tracking with a smart watch.  Morbid obesity (HCC) Improving with recent setback after a break-up with poor food choices for about 2 weeks.  He is back on track and motivated to continue working on medically supervised weight management.  He does lack insurance coverage for  GLP-1 receptor agonists  BMI 36.0-36.9,adult      He was informed of the importance of frequent follow up visits to maximize his success with intensive lifestyle modifications for his multiple health conditions.   ATTESTASTION STATEMENTS:  Reviewed by clinician on day of visit: allergies, medications, problem list, medical history, surgical history, family history, social history, and previous encounter notes pertinent to obesity diagnosis.   I have personally spent 30 minutes total time today in preparation, patient care, nutritional counseling and education,  and documentation for this visit, including the following: review of most recent clinical lab tests, prescribing medications/ refilling medications, reviewing medical assistant documentation, review and interpretation of bioimpedence results.     Chris Mcconnell, D.O. DABFM, DABOM [facility] [address] [place], [place] [address] [phone] "

## 2024-05-16 ENCOUNTER — Inpatient Hospital Stay: Attending: Hematology and Oncology | Admitting: Hematology and Oncology

## 2024-05-16 ENCOUNTER — Other Ambulatory Visit: Payer: Self-pay | Admitting: Hematology and Oncology

## 2024-05-16 ENCOUNTER — Inpatient Hospital Stay

## 2024-05-16 ENCOUNTER — Encounter: Payer: Self-pay | Admitting: Hematology and Oncology

## 2024-05-16 ENCOUNTER — Other Ambulatory Visit: Payer: Self-pay

## 2024-05-16 VITALS — BP 121/69 | HR 75 | Temp 98.8°F | Resp 16 | Ht 71.0 in | Wt 267.9 lb

## 2024-05-16 DIAGNOSIS — R7989 Other specified abnormal findings of blood chemistry: Secondary | ICD-10-CM

## 2024-05-16 DIAGNOSIS — D582 Other hemoglobinopathies: Secondary | ICD-10-CM | POA: Diagnosis not present

## 2024-05-16 LAB — CBC WITH DIFFERENTIAL (CANCER CENTER ONLY)
Abs Immature Granulocytes: 0.05 K/uL (ref 0.00–0.07)
Basophils Absolute: 0.1 K/uL (ref 0.0–0.1)
Basophils Relative: 1 %
Eosinophils Absolute: 0.3 K/uL (ref 0.0–0.5)
Eosinophils Relative: 3 %
HCT: 52.1 % — ABNORMAL HIGH (ref 39.0–52.0)
Hemoglobin: 17.9 g/dL — ABNORMAL HIGH (ref 13.0–17.0)
Immature Granulocytes: 1 %
Lymphocytes Relative: 17 %
Lymphs Abs: 1.4 K/uL (ref 0.7–4.0)
MCH: 29.8 pg (ref 26.0–34.0)
MCHC: 34.4 g/dL (ref 30.0–36.0)
MCV: 86.8 fL (ref 80.0–100.0)
Monocytes Absolute: 0.6 K/uL (ref 0.1–1.0)
Monocytes Relative: 8 %
Neutro Abs: 5.9 K/uL (ref 1.7–7.7)
Neutrophils Relative %: 70 %
Platelet Count: 174 K/uL (ref 150–400)
RBC: 6 MIL/uL — ABNORMAL HIGH (ref 4.22–5.81)
RDW: 12.4 % (ref 11.5–15.5)
WBC Count: 8.3 K/uL (ref 4.0–10.5)
nRBC: 0 % (ref 0.0–0.2)

## 2024-05-16 LAB — CMP (CANCER CENTER ONLY)
ALT: 20 U/L (ref 0–44)
AST: 19 U/L (ref 15–41)
Albumin: 4.4 g/dL (ref 3.5–5.0)
Alkaline Phosphatase: 76 U/L (ref 38–126)
Anion gap: 10 (ref 5–15)
BUN: 21 mg/dL — ABNORMAL HIGH (ref 6–20)
CO2: 23 mmol/L (ref 22–32)
Calcium: 9.5 mg/dL (ref 8.9–10.3)
Chloride: 110 mmol/L (ref 98–111)
Creatinine: 1.09 mg/dL (ref 0.61–1.24)
GFR, Estimated: >60 mL/min
Glucose, Bld: 92 mg/dL (ref 70–99)
Potassium: 3.8 mmol/L (ref 3.5–5.1)
Sodium: 143 mmol/L (ref 135–145)
Total Bilirubin: 0.6 mg/dL (ref 0.0–1.2)
Total Protein: 6.2 g/dL — ABNORMAL LOW (ref 6.5–8.1)

## 2024-05-16 LAB — FERRITIN: Ferritin: 201 ng/mL (ref 24–336)

## 2024-05-16 LAB — IRON AND TIBC
Iron: 75 ug/dL (ref 45–182)
Saturation Ratios: 23 % (ref 17.9–39.5)
TIBC: 328 ug/dL (ref 250–450)
UIBC: 253 ug/dL

## 2024-05-16 LAB — VITAMIN B12: Vitamin B-12: 490 pg/mL (ref 180–914)

## 2024-05-16 LAB — TSH: TSH: 1.51 u[IU]/mL (ref 0.350–4.500)

## 2024-05-16 LAB — FOLATE: Folate: 9.7 ng/mL

## 2024-05-16 NOTE — Progress Notes (Signed)
 " Chris Mcconnell [address] [place],  [place]  [address] [phone]  Clinic Day:  05/16/2024   Referring physician: Waylan Darice BRAVO, DO  Patient Care Team: Patient Care Team: Billy Knee, FNP as PCP - General (Internal Medicine)   REASON FOR CONSULTATION:  Elevated ferritin  HISTORY OF PRESENT ILLNESS:   Chris Mcconnell is a 44 y.o. male with a history of elevated ferritin who is referred in consultation by Darice Waylan, DO  for assessment and management. He states he has not been told in the past that he has elevated hemoglobin. He was found to have elevated hemoglobin, 17.9. He denies fever, chills, nausea or vomiting. He denies shortness of breath, chest pain or cough. He denies  issue with bowel or bladder. He denies changes in appetite or weight.   Medical history includes asthma, food allergies, depression, anxiety and stroke. Surgical history is significant for tonsillectomy/ adenoidectomy. Family history includes stroke, cancer, alcohol and drug abuse.     REVIEW OF SYSTEMS:   Review of Systems  Constitutional: Negative.   HENT:  Negative.    Eyes: Negative.   Respiratory: Negative.    Cardiovascular: Negative.   Gastrointestinal: Negative.   Endocrine: Negative.   Genitourinary: Negative.    Musculoskeletal: Negative.   Skin: Negative.   Neurological: Negative.   Hematological: Negative.   Psychiatric/Behavioral: Negative.       VITALS:   There were no vitals taken for this visit.  Wt Readings from Last 3 Encounters:  05/10/24 259 lb (117.5 kg)  03/24/24 251 lb (113.9 kg)  03/23/24 259 lb 6.4 oz (117.7 kg)    There is no height or weight on file to calculate BMI.  Performance status (ECOG): 1 - Symptomatic but completely ambulatory  PHYSICAL EXAM:   Physical Exam Constitutional:      Appearance: Normal appearance. He is normal weight.  HENT:     Head: Normocephalic and atraumatic.     Mouth/Throat:     Mouth: Mucous membranes  are moist.  Cardiovascular:     Rate and Rhythm: Normal rate and regular rhythm.     Pulses: Normal pulses.     Heart sounds: Normal heart sounds.  Pulmonary:     Effort: Pulmonary effort is normal.     Breath sounds: Normal breath sounds.  Abdominal:     General: Bowel sounds are normal.     Palpations: Abdomen is soft.  Musculoskeletal:        General: Normal range of motion.  Skin:    General: Skin is warm and dry.  Neurological:     General: No focal deficit present.     Mental Status: He is alert and oriented to person, place, and time. Mental status is at baseline.  Psychiatric:        Mood and Affect: Mood normal.        Behavior: Behavior normal.        Thought Content: Thought content normal.        Judgment: Judgment normal.      LABS:      Latest Ref Rng & Units 03/24/2024    8:56 AM 09/08/2023   11:40 AM 05/21/2023    9:26 AM  CBC  WBC 3.4 - 10.8 x10E3/uL 6.9  7.3  6.6   Hemoglobin 13.0 - 17.7 g/dL 81.1  82.7  82.0   Hematocrit 37.5 - 51.0 % 58.1  49.4  54.2   Platelets 150 - 450 x10E3/uL 180  170  183       Latest Ref Rng & Units 03/24/2024    8:56 AM 09/08/2023   11:40 AM 08/27/2023    8:36 AM  CMP  Glucose 70 - 99 mg/dL 85  98  87   BUN 6 - 24 mg/dL 15  19  14    Creatinine 0.76 - 1.27 mg/dL 8.85  9.06  8.89   Sodium 134 - 144 mmol/L 144  138  143   Potassium 3.5 - 5.2 mmol/L 4.4  4.2  4.0   Chloride 96 - 106 mmol/L 108  106  110   CO2 20 - 29 mmol/L 24  25  21    Calcium 8.7 - 10.2 mg/dL 9.8  8.9  8.9   Total Protein 6.0 - 8.5 g/dL 6.3  6.5  5.7   Total Bilirubin 0.0 - 1.2 mg/dL 1.0  0.9  0.8   Alkaline Phos 47 - 123 IU/L 81  61  87   AST 0 - 40 IU/L 17  20  17    ALT 0 - 44 IU/L 25  33  26      No results found for: CEA1, CEA / No results found for: CEA1, CEA No results found for: PSA1 No results found for: CAN199 No results found for: CAN125  No results found for: TOTALPROTELP, ALBUMINELP, A1GS, A2GS, BETS, BETA2SER,  GAMS, MSPIKE, SPEI No results found for: TIBC, FERRITIN, IRONPCTSAT No results found for: LDH  STUDIES:   No results found.    HISTORY:   Past Medical History:  Diagnosis Date   Allergy    Anxiety    Back pain    Depression    Multiple food allergies    Stroke (cerebrum) (HCC)     Past Surgical History:  Procedure Laterality Date   ADENOIDECTOMY     TONSILLECTOMY      Family History  Problem Relation Age of Onset   Cancer Mother    Stroke Mother    Alcohol abuse Maternal Uncle    Drug abuse Maternal Aunt     Social History:  reports that he has quit smoking. His smoking use included cigars. He does not have any smokeless tobacco history on file. He reports current alcohol use. He reports that he does not use drugs.The patient is alone  today.  Allergies: Allergies[1]  Current Medications: Current Outpatient Medications  Medication Sig Dispense Refill   Cholecalciferol (VITAMIN D3) 125 MCG (5000 UT) CAPS Take 1 capsule (5,000 Units total) by mouth daily. 30 capsule 1   EPINEPHrine 0.3 mg/0.3 mL IJ SOAJ injection Inject into the muscle.     fluticasone (FLONASE) 50 MCG/ACT nasal spray Place into the nose.     LORATADINE CHILDRENS 5 MG/5ML syrup Take by mouth. (Patient not taking: Reported on 03/23/2024)     magnesium 30 MG tablet Take 30 mg by mouth daily.     Misc. Devices MISC Start AutoCPAP at 5-15 cm. water pressure.  Prefer Resmed S11 AutoCPAP machine with a mask, supplies and heated tubing for mild OSA with AHI 13.  Mask of patient preference - please do new mask fitting.  Send to a local DME.     Topiramate  ER (TROKENDI  XR) 50 MG CP24 Take 1 capsule (50 mg total) by mouth daily. 90 capsule 0   Vitamin D , Ergocalciferol , (DRISDOL ) 1.25 MG (50000 UNIT) CAPS capsule Take 1 capsule (50,000 Units total) by mouth every 7 (seven) days. 10 capsule 0   No current facility-administered medications  for this visit.     ASSESSMENT & PLAN:   Assessment:   [name] is a 44 y.o. male with elevated hemoglobin. We discussed potential causes of elevated hemoglobin including those that would increase the body's need for oxygen such as smoking or sleep apnea. He denies smoking for several years and at then, only smoked occasionally. He does note that this year he was diagnosed with asthma and sleep apnea. He denies use of testosterone. Hemoglobin today is 17.9 and he remains asymptomatic. We have obtained iron studies today as well as JAK2. He will return to clinic in one week for review of pending labs.   Plan: 1.  Return to clinic in 2 weeks.   I discussed the assessment and treatment plan with the patient.  The patient was provided an opportunity to ask questions and all were answered.  The patient agreed with the plan and demonstrated an understanding of the instructions.    Thank you for the referral    45 minutes was spent in patient care.  This included time spent preparing to see the patient (e.g., review of tests), obtaining and/or reviewing separately obtained history, counseling and educating the patient/family/caregiver, ordering medications, tests, or procedures; documenting clinical information in the electronic or other health record, independently interpreting results and communicating results to the patient/family/caregiver as well as coordination of care.      Eleanor DELENA Bach, NP   Family Nurse Practitioner - Board Certified [facility] [phone]         [1]  Allergies Allergen Reactions   Latex Other (See Comments)    Stomach pain   "

## 2024-05-19 LAB — HEMOCHROMATOSIS DNA-PCR(C282Y,H63D)

## 2024-05-23 LAB — JAK2 V617F RFX CALR/MPL/E12-15

## 2024-05-23 LAB — CALR +MPL + E12-E15  (REFLEX)

## 2024-05-28 ENCOUNTER — Other Ambulatory Visit: Payer: Self-pay | Admitting: Family Medicine

## 2024-05-28 DIAGNOSIS — E559 Vitamin D deficiency, unspecified: Secondary | ICD-10-CM

## 2024-05-30 ENCOUNTER — Inpatient Hospital Stay: Admitting: Hematology and Oncology

## 2024-06-06 ENCOUNTER — Inpatient Hospital Stay: Admitting: Hematology and Oncology

## 2024-06-09 ENCOUNTER — Ambulatory Visit: Admitting: Family Medicine

## 2024-06-13 ENCOUNTER — Inpatient Hospital Stay: Admitting: Hematology and Oncology

## 2024-06-16 ENCOUNTER — Ambulatory Visit: Admitting: Family Medicine

## 2025-03-28 ENCOUNTER — Encounter: Admitting: Internal Medicine
